# Patient Record
Sex: Male | Born: 1990 | Race: White | Hispanic: Yes | Marital: Married | Smoking: Current every day smoker
Health system: Southern US, Community
[De-identification: ages and names within clinical notes are randomized; demographics above are authoritative.]

## PROBLEM LIST (undated history)

## (undated) DIAGNOSIS — J45909 Unspecified asthma, uncomplicated: Secondary | ICD-10-CM

## (undated) DIAGNOSIS — R109 Unspecified abdominal pain: Secondary | ICD-10-CM

## (undated) DIAGNOSIS — M1990 Unspecified osteoarthritis, unspecified site: Secondary | ICD-10-CM

## (undated) HISTORY — PX: WRIST FRACTURE SURGERY: SHX121

## (undated) HISTORY — PX: FRACTURE SURGERY: SHX138

## (undated) HISTORY — DX: Unspecified abdominal pain: R10.9

---

## 2017-10-30 ENCOUNTER — Encounter (HOSPITAL_COMMUNITY): Payer: Self-pay

## 2017-10-30 ENCOUNTER — Inpatient Hospital Stay (HOSPITAL_COMMUNITY): Payer: Medicaid Other

## 2017-10-30 ENCOUNTER — Inpatient Hospital Stay (HOSPITAL_COMMUNITY)
Admission: EM | Admit: 2017-10-30 | Discharge: 2017-11-02 | DRG: 571 | Disposition: A | Discharge status: Home / Self Care | Payer: Medicaid Other | Attending: Surgery | Admitting: Surgery

## 2017-10-30 ENCOUNTER — Emergency Department (HOSPITAL_COMMUNITY): Payer: Medicaid Other | Admitting: Anesthesiology

## 2017-10-30 ENCOUNTER — Emergency Department (HOSPITAL_COMMUNITY): Payer: Medicaid Other

## 2017-10-30 ENCOUNTER — Encounter (HOSPITAL_COMMUNITY): Admission: EM | Disposition: A | Discharge status: Home / Self Care | Payer: Self-pay | Source: Home / Self Care

## 2017-10-30 DIAGNOSIS — Z716 Tobacco abuse counseling: Secondary | ICD-10-CM

## 2017-10-30 DIAGNOSIS — E876 Hypokalemia: Secondary | ICD-10-CM | POA: Diagnosis present

## 2017-10-30 DIAGNOSIS — W25XXXA Contact with sharp glass, initial encounter: Secondary | ICD-10-CM | POA: Diagnosis present

## 2017-10-30 DIAGNOSIS — Z22322 Carrier or suspected carrier of Methicillin resistant Staphylococcus aureus: Secondary | ICD-10-CM | POA: Diagnosis not present

## 2017-10-30 DIAGNOSIS — D62 Acute posthemorrhagic anemia: Secondary | ICD-10-CM | POA: Diagnosis present

## 2017-10-30 DIAGNOSIS — F1010 Alcohol abuse, uncomplicated: Secondary | ICD-10-CM | POA: Diagnosis present

## 2017-10-30 DIAGNOSIS — S41112A Laceration without foreign body of left upper arm, initial encounter: Secondary | ICD-10-CM

## 2017-10-30 DIAGNOSIS — T1490XA Injury, unspecified, initial encounter: Secondary | ICD-10-CM

## 2017-10-30 DIAGNOSIS — F17210 Nicotine dependence, cigarettes, uncomplicated: Secondary | ICD-10-CM | POA: Diagnosis present

## 2017-10-30 DIAGNOSIS — S45112A Laceration of brachial artery, left side, initial encounter: Secondary | ICD-10-CM | POA: Diagnosis present

## 2017-10-30 DIAGNOSIS — Z4659 Encounter for fitting and adjustment of other gastrointestinal appliance and device: Secondary | ICD-10-CM

## 2017-10-30 DIAGNOSIS — Z978 Presence of other specified devices: Secondary | ICD-10-CM

## 2017-10-30 DIAGNOSIS — S45192A Other specified injury of brachial artery, left side, initial encounter: Secondary | ICD-10-CM

## 2017-10-30 DIAGNOSIS — S45102A Unspecified injury of brachial artery, left side, initial encounter: Secondary | ICD-10-CM

## 2017-10-30 DIAGNOSIS — S41119A Laceration without foreign body of unspecified upper arm, initial encounter: Secondary | ICD-10-CM | POA: Diagnosis present

## 2017-10-30 HISTORY — DX: Unspecified osteoarthritis, unspecified site: M19.90

## 2017-10-30 HISTORY — DX: Unspecified asthma, uncomplicated: J45.909

## 2017-10-30 HISTORY — PX: NERVE EXPLORATION: SHX2082

## 2017-10-30 HISTORY — PX: ARTERY REPAIR: SHX559

## 2017-10-30 LAB — COMPREHENSIVE METABOLIC PANEL
ALT: 23 U/L (ref 0–44)
ANION GAP: 15 (ref 5–15)
AST: 29 U/L (ref 15–41)
Albumin: 3.8 g/dL (ref 3.5–5.0)
Alkaline Phosphatase: 48 U/L (ref 38–126)
BILIRUBIN TOTAL: 0.5 mg/dL (ref 0.3–1.2)
BUN: 11 mg/dL (ref 6–20)
CALCIUM: 8.6 mg/dL — AB (ref 8.9–10.3)
CO2: 15 mmol/L — ABNORMAL LOW (ref 22–32)
CREATININE: 1.36 mg/dL — AB (ref 0.61–1.24)
Chloride: 110 mmol/L (ref 98–111)
GFR, EST AFRICAN AMERICAN: 35 mL/min — AB (ref >60)
GFR, EST NON AFRICAN AMERICAN: 30 mL/min — AB (ref >60)
Glucose, Bld: 168 mg/dL — ABNORMAL HIGH (ref 70–99)
Potassium: 3.4 mmol/L — ABNORMAL LOW (ref 3.5–5.1)
Sodium: 140 mmol/L (ref 135–145)
TOTAL PROTEIN: 6.5 g/dL (ref 6.5–8.1)

## 2017-10-30 LAB — CBC
HCT: 39.4 % (ref 39.0–52.0)
HEMATOCRIT: 31.8 % — AB (ref 39.0–52.0)
HEMOGLOBIN: 13 g/dL (ref 13.0–17.0)
HEMOGLOBIN: 10.3 g/dL — AB (ref 13.0–17.0)
MCH: 28.2 pg (ref 26.0–34.0)
MCH: 28.8 pg (ref 26.0–34.0)
MCHC: 33 g/dL (ref 30.0–36.0)
MCHC: 32.4 g/dL (ref 30.0–36.0)
MCV: 85.5 fL (ref 78.0–100.0)
MCV: 88.8 fL (ref 78.0–100.0)
PLATELETS: 291 10*3/uL (ref 150–400)
Platelets: 200 10*3/uL (ref 150–400)
RBC: 4.61 MIL/uL (ref 4.22–5.81)
RBC: 3.58 MIL/uL — ABNORMAL LOW (ref 4.22–5.81)
RDW: 12.8 % (ref 11.5–15.5)
RDW: 13.2 % (ref 11.5–15.5)
WBC: 8.7 10*3/uL (ref 4.0–10.5)
WBC: 15.4 10*3/uL — AB (ref 4.0–10.5)

## 2017-10-30 LAB — PROTIME-INR
INR: 0.99
Prothrombin Time: 13 seconds (ref 11.4–15.2)

## 2017-10-30 LAB — I-STAT CG4 LACTIC ACID, ED: LACTIC ACID, VENOUS: 3 mmol/L — AB (ref 0.5–1.9)

## 2017-10-30 LAB — TYPE AND SCREEN
ABO/RH(D): O POS
Antibody Screen: NEGATIVE
UNIT DIVISION: 0
Unit division: 0

## 2017-10-30 LAB — PREPARE FRESH FROZEN PLASMA
Unit division: 0
Unit division: 0

## 2017-10-30 LAB — RAPID URINE DRUG SCREEN, HOSP PERFORMED
Amphetamines: NOT DETECTED
Benzodiazepines: POSITIVE — AB
COCAINE: POSITIVE — AB
OPIATES: POSITIVE — AB
Tetrahydrocannabinol: POSITIVE — AB

## 2017-10-30 LAB — URINALYSIS, ROUTINE W REFLEX MICROSCOPIC
Bilirubin Urine: NEGATIVE
Glucose, UA: NEGATIVE mg/dL
Ketones, ur: NEGATIVE mg/dL
LEUKOCYTES UA: NEGATIVE
Nitrite: NEGATIVE
PH: 7 (ref 5.0–8.0)
Protein, ur: NEGATIVE mg/dL
RBC / HPF: >50 RBC/hpf — ABNORMAL HIGH (ref 0–5)
SPECIFIC GRAVITY, URINE: 1.014 (ref 1.005–1.030)

## 2017-10-30 LAB — BPAM FFP
BLOOD PRODUCT EXPIRATION DATE: 201908042359
Blood Product Expiration Date: 201908052359
ISSUE DATE / TIME: 201907160300
ISSUE DATE / TIME: 201907160300
UNIT TYPE AND RH: 6200
Unit Type and Rh: 6200

## 2017-10-30 LAB — POCT I-STAT 3, ART BLOOD GAS (G3+)
Acid-base deficit: 9 mmol/L — ABNORMAL HIGH (ref 0.0–2.0)
BICARBONATE: 18.5 mmol/L — AB (ref 20.0–28.0)
O2 Saturation: 100 %
PO2 ART: 222 mmHg — AB (ref 83.0–108.0)
Patient temperature: 98.8
TCO2: 20 mmol/L — AB (ref 22–32)
pCO2 arterial: 47 mmHg (ref 32.0–48.0)
pH, Arterial: 7.204 — ABNORMAL LOW (ref 7.350–7.450)

## 2017-10-30 LAB — POCT I-STAT, CHEM 8
BUN: 11 mg/dL (ref 6–20)
CREATININE: 1.7 mg/dL — AB (ref 0.61–1.24)
Calcium, Ion: 0.95 mmol/L — ABNORMAL LOW (ref 1.15–1.40)
Chloride: 108 mmol/L (ref 98–111)
GLUCOSE: 170 mg/dL — AB (ref 70–99)
HEMATOCRIT: 38 % — AB (ref 39.0–52.0)
Hemoglobin: 12.9 g/dL — ABNORMAL LOW (ref 13.0–17.0)
Potassium: 3.3 mmol/L — ABNORMAL LOW (ref 3.5–5.1)
Sodium: 139 mmol/L (ref 135–145)
TCO2: 14 mmol/L — ABNORMAL LOW (ref 22–32)

## 2017-10-30 LAB — POCT I-STAT 4, (NA,K, GLUC, HGB,HCT)
GLUCOSE: 127 mg/dL — AB (ref 70–99)
HCT: 28 % — ABNORMAL LOW (ref 39.0–52.0)
HEMOGLOBIN: 9.5 g/dL — AB (ref 13.0–17.0)
Potassium: 3.6 mmol/L (ref 3.5–5.1)
Sodium: 141 mmol/L (ref 135–145)

## 2017-10-30 LAB — CDS SEROLOGY

## 2017-10-30 LAB — I-STAT CHEM 8, ED
BUN: 11 mg/dL (ref 8–23)
CALCIUM ION: 0.95 mmol/L — AB (ref 1.15–1.40)
CREATININE: 1.7 mg/dL — AB (ref 0.61–1.24)
Chloride: 108 mmol/L (ref 98–111)
GLUCOSE: 170 mg/dL — AB (ref 70–99)
HCT: 38 % — ABNORMAL LOW (ref 39.0–52.0)
Hemoglobin: 12.9 g/dL — ABNORMAL LOW (ref 13.0–17.0)
Potassium: 3.3 mmol/L — ABNORMAL LOW (ref 3.5–5.1)
Sodium: 139 mmol/L (ref 135–145)
TCO2: 14 mmol/L — ABNORMAL LOW (ref 22–32)

## 2017-10-30 LAB — BPAM RBC
Blood Product Expiration Date: 201908082359
Blood Product Expiration Date: 201908122359
ISSUE DATE / TIME: 201907160259
ISSUE DATE / TIME: 201907160259
UNIT TYPE AND RH: 9500
Unit Type and Rh: 9500

## 2017-10-30 LAB — ABO/RH: ABO/RH(D): O POS

## 2017-10-30 LAB — LACTIC ACID, PLASMA: LACTIC ACID, VENOUS: 2.4 mmol/L — AB (ref 0.5–1.9)

## 2017-10-30 LAB — MRSA PCR SCREENING: MRSA BY PCR: POSITIVE — AB

## 2017-10-30 LAB — BLOOD PRODUCT ORDER (VERBAL) VERIFICATION

## 2017-10-30 LAB — TRIGLYCERIDES: Triglycerides: 131 mg/dL (ref <150)

## 2017-10-30 LAB — ETHANOL: ALCOHOL ETHYL (B): 260 mg/dL — AB (ref <10)

## 2017-10-30 SURGERY — REPAIR, ARTERY, BRACHIAL
Anesthesia: General | Site: Arm Upper | Laterality: Left

## 2017-10-30 MED ORDER — LORAZEPAM 2 MG/ML IJ SOLN
0.0000 mg | Freq: Four times a day (QID) | INTRAMUSCULAR | Status: AC
  Administered 2017-10-31 (×3): 2 mg via INTRAVENOUS
  Filled 2017-10-30 (×3): qty 1

## 2017-10-30 MED ORDER — LORAZEPAM 2 MG/ML IJ SOLN
1.0000 mg | Freq: Four times a day (QID) | INTRAMUSCULAR | Status: AC | PRN
  Administered 2017-11-01: 1 mg via INTRAVENOUS
  Filled 2017-10-30: qty 1

## 2017-10-30 MED ORDER — ENOXAPARIN SODIUM 40 MG/0.4ML ~~LOC~~ SOLN
40.0000 mg | SUBCUTANEOUS | Status: DC
  Administered 2017-10-31 – 2017-11-02 (×3): 40 mg via SUBCUTANEOUS
  Filled 2017-10-30 (×3): qty 0.4

## 2017-10-30 MED ORDER — POTASSIUM CHLORIDE IN NACL 20-0.9 MEQ/L-% IV SOLN
INTRAVENOUS | Status: DC
  Administered 2017-10-30 – 2017-11-01 (×8): via INTRAVENOUS
  Administered 2017-11-02: 1 mL via INTRAVENOUS
  Filled 2017-10-30 (×8): qty 1000

## 2017-10-30 MED ORDER — EPHEDRINE SULFATE 50 MG/ML IJ SOLN
INTRAMUSCULAR | Status: AC
  Filled 2017-10-30: qty 1

## 2017-10-30 MED ORDER — PROPOFOL 1000 MG/100ML IV EMUL
0.0000 ug/kg/min | INTRAVENOUS | Status: DC
  Administered 2017-10-30: 45 ug/kg/min via INTRAVENOUS
  Administered 2017-10-30: 50 ug/kg/min via INTRAVENOUS
  Filled 2017-10-30 (×2): qty 100

## 2017-10-30 MED ORDER — ROCURONIUM BROMIDE 10 MG/ML (PF) SYRINGE
PREFILLED_SYRINGE | INTRAVENOUS | Status: AC
  Filled 2017-10-30: qty 10

## 2017-10-30 MED ORDER — SODIUM CHLORIDE 0.9 % IR SOLN
Status: DC | PRN
  Administered 2017-10-30: 1000 mL

## 2017-10-30 MED ORDER — FENTANYL CITRATE (PF) 100 MCG/2ML IJ SOLN
100.0000 ug | INTRAMUSCULAR | Status: AC | PRN
  Administered 2017-10-30 (×3): 100 ug via INTRAVENOUS
  Filled 2017-10-30 (×3): qty 2

## 2017-10-30 MED ORDER — KETAMINE HCL 50 MG/5ML IJ SOSY
PREFILLED_SYRINGE | INTRAMUSCULAR | Status: AC
  Filled 2017-10-30: qty 5

## 2017-10-30 MED ORDER — PROPOFOL 1000 MG/100ML IV EMUL
5.0000 ug/kg/min | INTRAVENOUS | Status: DC
  Administered 2017-10-30: 20 ug/kg/min via INTRAVENOUS
  Administered 2017-10-31: 10 ug/kg/min via INTRAVENOUS
  Filled 2017-10-30: qty 100

## 2017-10-30 MED ORDER — ALBUMIN HUMAN 5 % IV SOLN
25.0000 g | Freq: Once | INTRAVENOUS | Status: AC
  Administered 2017-10-30: 25 g via INTRAVENOUS
  Filled 2017-10-30: qty 250

## 2017-10-30 MED ORDER — POTASSIUM CHLORIDE 10 MEQ/100ML IV SOLN
10.0000 meq | INTRAVENOUS | Status: AC
Start: 2017-10-30 — End: 2017-10-30
  Administered 2017-10-30 (×2): 10 meq via INTRAVENOUS
  Filled 2017-10-30: qty 100

## 2017-10-30 MED ORDER — ADULT MULTIVITAMIN W/MINERALS CH
1.0000 | ORAL_TABLET | Freq: Every day | ORAL | Status: DC
Start: 2017-10-30 — End: 2017-11-02
  Administered 2017-10-30 – 2017-11-02 (×2): 1 via ORAL
  Filled 2017-10-30 (×3): qty 1

## 2017-10-30 MED ORDER — MUPIROCIN 2 % EX OINT
1.0000 "application " | TOPICAL_OINTMENT | Freq: Two times a day (BID) | CUTANEOUS | Status: DC
  Administered 2017-10-30 – 2017-11-02 (×6): 1 via NASAL

## 2017-10-30 MED ORDER — DEXMEDETOMIDINE HCL IN NACL 400 MCG/100ML IV SOLN
0.4000 ug/kg/h | INTRAVENOUS | Status: DC
  Administered 2017-10-30: 0.4 ug/kg/h via INTRAVENOUS
  Administered 2017-10-30 – 2017-10-31 (×3): 0.8 ug/kg/h via INTRAVENOUS
  Administered 2017-10-31 – 2017-11-01 (×3): 1 ug/kg/h via INTRAVENOUS
  Administered 2017-11-01: 0.8 ug/kg/h via INTRAVENOUS
  Administered 2017-11-01: 1.2 ug/kg/h via INTRAVENOUS
  Filled 2017-10-30 (×4): qty 100
  Filled 2017-10-30: qty 200
  Filled 2017-10-30 (×4): qty 100

## 2017-10-30 MED ORDER — ORAL CARE MOUTH RINSE
15.0000 mL | OROMUCOSAL | Status: DC
Start: 2017-10-30 — End: 2017-11-02
  Administered 2017-10-30 – 2017-11-01 (×21): 15 mL via OROMUCOSAL

## 2017-10-30 MED ORDER — HEPARIN SODIUM (PORCINE) 1000 UNIT/ML IJ SOLN
INTRAMUSCULAR | Status: DC | PRN
  Administered 2017-10-30: 8000 [IU] via INTRAVENOUS

## 2017-10-30 MED ORDER — LORAZEPAM 2 MG/ML IJ SOLN
0.0000 mg | Freq: Two times a day (BID) | INTRAMUSCULAR | Status: DC
  Administered 2017-11-01: 2 mg via INTRAVENOUS
  Filled 2017-10-30: qty 1

## 2017-10-30 MED ORDER — SUGAMMADEX SODIUM 200 MG/2ML IV SOLN
INTRAVENOUS | Status: AC
  Filled 2017-10-30: qty 2

## 2017-10-30 MED ORDER — ONDANSETRON HCL 4 MG/2ML IJ SOLN: 4.0000 mg | Freq: Four times a day (QID) | INTRAMUSCULAR | Status: DC | PRN

## 2017-10-30 MED ORDER — SODIUM CHLORIDE 0.9 % IV SOLN
INTRAVENOUS | Status: AC
  Filled 2017-10-30: qty 1.2

## 2017-10-30 MED ORDER — FOLIC ACID 1 MG PO TABS
1.0000 mg | ORAL_TABLET | Freq: Every day | ORAL | Status: DC
  Administered 2017-10-30 – 2017-11-02 (×2): 1 mg via ORAL
  Filled 2017-10-30 (×3): qty 1

## 2017-10-30 MED ORDER — ONDANSETRON HCL 4 MG/2ML IJ SOLN
INTRAMUSCULAR | Status: DC | PRN
  Administered 2017-10-30: 4 mg via INTRAVENOUS

## 2017-10-30 MED ORDER — SODIUM CHLORIDE 0.9 % IJ SOLN
INTRAMUSCULAR | Status: AC
  Filled 2017-10-30: qty 10

## 2017-10-30 MED ORDER — CEFAZOLIN SODIUM-DEXTROSE 2-3 GM-%(50ML) IV SOLR
INTRAVENOUS | Status: DC | PRN
  Administered 2017-10-30: 2 g via INTRAVENOUS

## 2017-10-30 MED ORDER — LIDOCAINE 2% (20 MG/ML) 5 ML SYRINGE
INTRAMUSCULAR | Status: AC
  Filled 2017-10-30: qty 5

## 2017-10-30 MED ORDER — LORAZEPAM 2 MG/ML IJ SOLN: 0.0000 mg | Freq: Two times a day (BID) | INTRAMUSCULAR | Status: DC

## 2017-10-30 MED ORDER — MIDAZOLAM HCL 2 MG/2ML IJ SOLN
2.0000 mg | INTRAMUSCULAR | Status: DC | PRN
  Administered 2017-10-30 – 2017-11-01 (×3): 2 mg via INTRAVENOUS
  Filled 2017-10-30 (×3): qty 2

## 2017-10-30 MED ORDER — ALBUMIN HUMAN 5 % IV SOLN
INTRAVENOUS | Status: AC
  Filled 2017-10-30: qty 250

## 2017-10-30 MED ORDER — FENTANYL 2500MCG IN NS 250ML (10MCG/ML) PREMIX INFUSION
0.0000 ug/h | INTRAVENOUS | Status: DC
  Administered 2017-10-30: 50 ug/h via INTRAVENOUS
  Filled 2017-10-30: qty 250

## 2017-10-30 MED ORDER — OXYCODONE HCL 5 MG PO TABS
5.0000 mg | ORAL_TABLET | ORAL | Status: DC | PRN
  Administered 2017-11-01 – 2017-11-02 (×6): 5 mg via ORAL
  Filled 2017-10-30 (×6): qty 1

## 2017-10-30 MED ORDER — HEPARIN SODIUM (PORCINE) 1000 UNIT/ML IJ SOLN
INTRAMUSCULAR | Status: AC
  Filled 2017-10-30: qty 1

## 2017-10-30 MED ORDER — SODIUM CHLORIDE 0.9 % IV SOLN
INTRAVENOUS | Status: DC
  Administered 2017-10-30: 14:00:00 via INTRAVENOUS

## 2017-10-30 MED ORDER — MORPHINE SULFATE (PF) 2 MG/ML IV SOLN
2.0000 mg | INTRAVENOUS | Status: DC | PRN
  Administered 2017-11-01: 2 mg via INTRAVENOUS
  Filled 2017-10-30: qty 1

## 2017-10-30 MED ORDER — PROPOFOL 10 MG/ML IV BOLUS
INTRAVENOUS | Status: DC | PRN
  Administered 2017-10-30: 170 mg via INTRAVENOUS

## 2017-10-30 MED ORDER — LACTATED RINGERS IV SOLN
INTRAVENOUS | Status: DC | PRN
  Administered 2017-10-30 (×2): via INTRAVENOUS

## 2017-10-30 MED ORDER — FENTANYL CITRATE (PF) 250 MCG/5ML IJ SOLN
INTRAMUSCULAR | Status: AC
  Filled 2017-10-30: qty 5

## 2017-10-30 MED ORDER — MORPHINE SULFATE (PF) 4 MG/ML IV SOLN
4.0000 mg | Freq: Once | INTRAVENOUS | Status: AC
  Administered 2017-10-30: 4 mg via INTRAVENOUS

## 2017-10-30 MED ORDER — TETANUS-DIPHTH-ACELL PERTUSSIS 5-2.5-18.5 LF-MCG/0.5 IM SUSP
0.5000 mL | Freq: Once | INTRAMUSCULAR | Status: AC
  Administered 2017-10-30: 0.5 mL via INTRAMUSCULAR
  Filled 2017-10-30: qty 0.5

## 2017-10-30 MED ORDER — LIDOCAINE HCL (CARDIAC) PF 100 MG/5ML IV SOSY
PREFILLED_SYRINGE | INTRAVENOUS | Status: DC | PRN
  Administered 2017-10-30: 60 mg via INTRATRACHEAL

## 2017-10-30 MED ORDER — ARTIFICIAL TEARS OPHTHALMIC OINT
TOPICAL_OINTMENT | OPHTHALMIC | Status: AC
  Filled 2017-10-30: qty 3.5

## 2017-10-30 MED ORDER — THIAMINE HCL 100 MG/ML IJ SOLN: 100.0000 mg | Freq: Every day | INTRAMUSCULAR | Status: DC

## 2017-10-30 MED ORDER — MORPHINE SULFATE (PF) 4 MG/ML IV SOLN
4.0000 mg | INTRAVENOUS | Status: DC | PRN
  Filled 2017-10-30: qty 1

## 2017-10-30 MED ORDER — LORAZEPAM 2 MG/ML IJ SOLN: 0.0000 mg | Freq: Four times a day (QID) | INTRAMUSCULAR | Status: DC

## 2017-10-30 MED ORDER — IPRATROPIUM-ALBUTEROL 0.5-2.5 (3) MG/3ML IN SOLN: 3.0000 mL | Freq: Four times a day (QID) | RESPIRATORY_TRACT | Status: DC | PRN

## 2017-10-30 MED ORDER — PROPOFOL 500 MG/50ML IV EMUL
INTRAVENOUS | Status: DC | PRN
  Administered 2017-10-30: 25 ug/kg/min via INTRAVENOUS

## 2017-10-30 MED ORDER — CHLORHEXIDINE GLUCONATE 0.12% ORAL RINSE (MEDLINE KIT)
15.0000 mL | Freq: Two times a day (BID) | OROMUCOSAL | Status: DC
  Administered 2017-10-30 – 2017-11-01 (×5): 15 mL via OROMUCOSAL

## 2017-10-30 MED ORDER — OXYCODONE HCL 5 MG PO TABS: 2.5000 mg | ORAL_TABLET | ORAL | Status: DC | PRN

## 2017-10-30 MED ORDER — DEXAMETHASONE SODIUM PHOSPHATE 10 MG/ML IJ SOLN
INTRAMUSCULAR | Status: DC | PRN
  Administered 2017-10-30: 10 mg via INTRAVENOUS

## 2017-10-30 MED ORDER — MORPHINE SULFATE (PF) 4 MG/ML IV SOLN
INTRAVENOUS | Status: AC
  Filled 2017-10-30: qty 1

## 2017-10-30 MED ORDER — PROPOFOL 10 MG/ML IV BOLUS
INTRAVENOUS | Status: AC
  Filled 2017-10-30: qty 20

## 2017-10-30 MED ORDER — CHLORHEXIDINE GLUCONATE CLOTH 2 % EX PADS
6.0000 | MEDICATED_PAD | Freq: Every day | CUTANEOUS | Status: DC
  Administered 2017-10-31 – 2017-11-01 (×2): 6 via TOPICAL

## 2017-10-30 MED ORDER — PHENYLEPHRINE 40 MCG/ML (10ML) SYRINGE FOR IV PUSH (FOR BLOOD PRESSURE SUPPORT)
PREFILLED_SYRINGE | INTRAVENOUS | Status: AC
  Filled 2017-10-30: qty 10

## 2017-10-30 MED ORDER — SUCCINYLCHOLINE CHLORIDE 200 MG/10ML IV SOSY
PREFILLED_SYRINGE | INTRAVENOUS | Status: AC
  Filled 2017-10-30: qty 10

## 2017-10-30 MED ORDER — KETAMINE HCL 50 MG/5ML IJ SOSY
50.0000 mg | PREFILLED_SYRINGE | Freq: Once | INTRAMUSCULAR | Status: AC
  Administered 2017-10-30: 50 mg via INTRAVENOUS

## 2017-10-30 MED ORDER — FENTANYL CITRATE (PF) 250 MCG/5ML IJ SOLN
INTRAMUSCULAR | Status: DC | PRN
  Administered 2017-10-30: 100 ug via INTRAVENOUS
  Administered 2017-10-30 (×2): 50 ug via INTRAVENOUS

## 2017-10-30 MED ORDER — SUCCINYLCHOLINE CHLORIDE 20 MG/ML IJ SOLN
INTRAMUSCULAR | Status: DC | PRN
  Administered 2017-10-30: 120 mg via INTRAVENOUS

## 2017-10-30 MED ORDER — ROCURONIUM BROMIDE 100 MG/10ML IV SOLN
INTRAVENOUS | Status: DC | PRN
  Administered 2017-10-30: 30 mg via INTRAVENOUS
  Administered 2017-10-30: 20 mg via INTRAVENOUS
  Administered 2017-10-30: 30 mg via INTRAVENOUS

## 2017-10-30 MED ORDER — MIDAZOLAM HCL 5 MG/5ML IJ SOLN
INTRAMUSCULAR | Status: DC | PRN
  Administered 2017-10-30: 2 mg via INTRAVENOUS

## 2017-10-30 MED ORDER — ALBUMIN HUMAN 5 % IV SOLN
25.0000 g | Freq: Once | INTRAVENOUS | Status: AC
Start: 2017-10-30 — End: 2017-10-30
  Administered 2017-10-30: 25 g via INTRAVENOUS
  Filled 2017-10-30 (×2): qty 500

## 2017-10-30 MED ORDER — ONDANSETRON 4 MG PO TBDP: 4.0000 mg | ORAL_TABLET | Freq: Four times a day (QID) | ORAL | Status: DC | PRN

## 2017-10-30 MED ORDER — OXYCODONE HCL 5 MG PO TABS: 10.0000 mg | ORAL_TABLET | ORAL | Status: DC | PRN

## 2017-10-30 MED ORDER — MORPHINE SULFATE 2 MG/ML IJ SOLN
INTRAMUSCULAR | Status: AC | PRN
  Administered 2017-10-30: 4 mg via INTRAVENOUS

## 2017-10-30 MED ORDER — MORPHINE SULFATE (PF) 2 MG/ML IV SOLN: 1.0000 mg | INTRAVENOUS | Status: DC | PRN

## 2017-10-30 MED ORDER — FENTANYL CITRATE (PF) 100 MCG/2ML IJ SOLN
100.0000 ug | INTRAMUSCULAR | Status: DC | PRN
  Administered 2017-10-30: 100 ug via INTRAVENOUS

## 2017-10-30 MED ORDER — LORAZEPAM 1 MG PO TABS: 1.0000 mg | ORAL_TABLET | Freq: Four times a day (QID) | ORAL | Status: AC | PRN

## 2017-10-30 MED ORDER — SODIUM CHLORIDE 0.9 % IV SOLN
INTRAVENOUS | Status: DC | PRN
  Administered 2017-10-30: 500 mL

## 2017-10-30 MED ORDER — ONDANSETRON HCL 4 MG/2ML IJ SOLN
INTRAMUSCULAR | Status: AC
  Filled 2017-10-30: qty 2

## 2017-10-30 MED ORDER — MIDAZOLAM HCL 2 MG/2ML IJ SOLN
INTRAMUSCULAR | Status: AC
  Filled 2017-10-30: qty 2

## 2017-10-30 MED ORDER — CEFAZOLIN SODIUM 1 G IJ SOLR
INTRAMUSCULAR | Status: AC
  Filled 2017-10-30: qty 20

## 2017-10-30 MED ORDER — SODIUM CHLORIDE 0.9 % IV SOLN
INTRAVENOUS | Status: AC | PRN
  Administered 2017-10-30: 1000 mL via INTRAVENOUS

## 2017-10-30 MED ORDER — ALBUMIN HUMAN 5 % IV SOLN
INTRAVENOUS | Status: DC | PRN
  Administered 2017-10-30 (×2): via INTRAVENOUS

## 2017-10-30 MED ORDER — VITAMIN B-1 100 MG PO TABS
100.0000 mg | ORAL_TABLET | Freq: Every day | ORAL | Status: DC
  Administered 2017-10-30 – 2017-11-02 (×2): 100 mg via ORAL
  Filled 2017-10-30 (×3): qty 1

## 2017-10-30 SURGICAL SUPPLY — 55 items
BANDAGE ACE 4X5 VEL STRL LF (GAUZE/BANDAGES/DRESSINGS) ×6 IMPLANT
BANDAGE ELASTIC 2 LF NS (GAUZE/BANDAGES/DRESSINGS) ×3 IMPLANT
BNDG ESMARK 4X9 LF (GAUZE/BANDAGES/DRESSINGS) IMPLANT
BNDG GAUZE ELAST 4 BULKY (GAUZE/BANDAGES/DRESSINGS) ×3 IMPLANT
CANISTER SUCT 3000ML PPV (MISCELLANEOUS) ×3 IMPLANT
CATH EMB 3FR 80CM (CATHETERS) ×3 IMPLANT
CUFF TOURNIQUET SINGLE 18IN (TOURNIQUET CUFF) IMPLANT
CUFF TOURNIQUET SINGLE 24IN (TOURNIQUET CUFF) IMPLANT
CUFF TOURNIQUET SINGLE 34IN LL (TOURNIQUET CUFF) IMPLANT
DECANTER SPIKE VIAL GLASS SM (MISCELLANEOUS) IMPLANT
DRSG ADAPTIC 3X8 NADH LF (GAUZE/BANDAGES/DRESSINGS) ×3 IMPLANT
ELECT REM PT RETURN 9FT ADLT (ELECTROSURGICAL) ×3
ELECTRODE REM PT RTRN 9FT ADLT (ELECTROSURGICAL) ×2 IMPLANT
GAUZE SPONGE 4X4 12PLY STRL (GAUZE/BANDAGES/DRESSINGS) ×3 IMPLANT
GAUZE SPONGE 4X4 16PLY XRAY LF (GAUZE/BANDAGES/DRESSINGS) IMPLANT
GLOVE BIO SURGEON STRL SZ7.5 (GLOVE) ×6 IMPLANT
GLOVE BIOGEL PI IND STRL 6.5 (GLOVE) ×2 IMPLANT
GLOVE BIOGEL PI IND STRL 7.0 (GLOVE) ×2 IMPLANT
GLOVE BIOGEL PI IND STRL 7.5 (GLOVE) ×2 IMPLANT
GLOVE BIOGEL PI IND STRL 8 (GLOVE) ×2 IMPLANT
GLOVE BIOGEL PI INDICATOR 6.5 (GLOVE) ×1
GLOVE BIOGEL PI INDICATOR 7.0 (GLOVE) ×1
GLOVE BIOGEL PI INDICATOR 7.5 (GLOVE) ×1
GLOVE BIOGEL PI INDICATOR 8 (GLOVE) ×1
GLOVE SURG SS PI 7.5 STRL IVOR (GLOVE) ×6 IMPLANT
GOWN STRL REUS W/ TWL LRG LVL3 (GOWN DISPOSABLE) ×8 IMPLANT
GOWN STRL REUS W/ TWL XL LVL3 (GOWN DISPOSABLE) ×2 IMPLANT
GOWN STRL REUS W/TWL LRG LVL3 (GOWN DISPOSABLE) ×4
GOWN STRL REUS W/TWL XL LVL3 (GOWN DISPOSABLE) ×1
KIT BASIN OR (CUSTOM PROCEDURE TRAY) ×3 IMPLANT
KIT TURNOVER KIT B (KITS) ×3 IMPLANT
NS IRRIG 1000ML POUR BTL (IV SOLUTION) ×3 IMPLANT
PACK CV ACCESS (CUSTOM PROCEDURE TRAY) IMPLANT
PAD ARMBOARD 7.5X6 YLW CONV (MISCELLANEOUS) ×6 IMPLANT
PAD CAST 4YDX4 CTTN HI CHSV (CAST SUPPLIES) IMPLANT
PADDING CAST COTTON 4X4 STRL (CAST SUPPLIES)
PADDING CAST SYNTHETIC 3 NS LF (CAST SUPPLIES) ×1
PADDING CAST SYNTHETIC 3X4 NS (CAST SUPPLIES) ×2 IMPLANT
SPONGE SURGIFOAM ABS GEL 100 (HEMOSTASIS) IMPLANT
STAPLER VISISTAT 35W (STAPLE) ×3 IMPLANT
STRIP CLOSURE SKIN 1/2X4 (GAUZE/BANDAGES/DRESSINGS) IMPLANT
SUT ETHILON 8 0 BV130 4 (SUTURE) ×3 IMPLANT
SUT PROLENE 5 0 C 1 24 (SUTURE) ×3 IMPLANT
SUT PROLENE 6 0 CC (SUTURE) ×15 IMPLANT
SUT PROLENE 7 0 BV 1 (SUTURE) ×9 IMPLANT
SUT VIC AB 0 CT1 27 (SUTURE) ×1
SUT VIC AB 0 CT1 27XBRD ANBCTR (SUTURE) ×2 IMPLANT
SUT VIC AB 3-0 SH 27 (SUTURE) ×1
SUT VIC AB 3-0 SH 27X BRD (SUTURE) ×2 IMPLANT
SUT VICRYL 4-0 PS2 18IN ABS (SUTURE) ×3 IMPLANT
SUT VICRYL RAPIDE 4/0 PS 2 (SUTURE) ×3 IMPLANT
SYRINGE 3CC LL L/F (MISCELLANEOUS) ×3 IMPLANT
TOWEL GREEN STERILE (TOWEL DISPOSABLE) ×3 IMPLANT
UNDERPAD 30X30 (UNDERPADS AND DIAPERS) ×3 IMPLANT
WATER STERILE IRR 1000ML POUR (IV SOLUTION) ×3 IMPLANT
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
## 2017-10-30 NOTE — Anesthesia Procedure Notes (Signed)
Procedure Name: Intubation Date/Time: 10/30/2017 5:18 AM Performed by: Claudina LickMahony, Donell Sliwinski D, CRNA Pre-anesthesia Checklist: Patient identified, Emergency Drugs available, Suction available, Patient being monitored and Timeout performed Patient Re-evaluated:Patient Re-evaluated prior to induction Oxygen Delivery Method: Circle system utilized Preoxygenation: Pre-oxygenation with 100% oxygen Induction Type: IV induction, Rapid sequence and Cricoid Pressure applied Laryngoscope Size: Miller and 2 Grade View: Grade I Tube type: Subglottic suction tube Tube size: 7.5 mm Number of attempts: 1 Airway Equipment and Method: Stylet (Cervical collar loosened for inuduction. Head/neck mainatained in neutral position for induction/DL. Cervical collar tightened to origianl position after induction) Placement Confirmation: ETT inserted through vocal cords under direct vision,  positive ETCO2 and breath sounds checked- equal and bilateral Secured at: 22 cm Tube secured with: Tape Dental Injury: Teeth and Oropharynx as per pre-operative assessment

## 2017-10-30 NOTE — Progress Notes (Signed)
Patient ID: Kyle Gates, male   DOB: 04/17/1876, 27 y.o.   MRN: [id number] Follow up - Trauma Critical Care  Patient Details:    Kyle Gates is an 27 y.o. male.  Lines/tubes : Airway 7.5 mm (Active)  Secured at (cm) 23 cm 10/30/2017  8:22 AM  Measured From Lips 10/30/2017  8:22 AM  Secured Location Right 10/30/2017  8:22 AM  Secured By Pink Tape 10/30/2017  8:22 AM  Cuff Pressure (cm H2O) 30 cm H2O 10/30/2017  8:22 AM  Site Condition Dry 10/30/2017  8:22 AM     Urethral Catheter Jess Barters RNFA Latex 14 Fr. (Active)  Output (mL) 200 mL 10/30/2017  8:00 AM    Microbiology/Sepsis markers: No results found for this or any previous visit.  Anti-infectives:  Anti-infectives (From admission, onward)   None      Best Practice/Protocols:  VTE Prophylaxis: Lovenox (prophylaxtic dose) Continous Sedation  Consults: Treatment Team:  Sherren Kerns, MD    Studies:    Events:  Subjective:    Overnight Issues:   Objective:  Vital signs for last 24 hours: Temp:  [96.1 F (35.6 C)-97.8 F (36.6 C)] 97.8 F (36.6 C) (07/16 0800) Pulse Rate:  [100-112] 111 (07/16 0830) Resp:  [11-20] 16 (07/16 0830) BP: (113-128)/(76-94) 128/84 (07/16 0830) SpO2:  [99 %-100 %] 100 % (07/16 0830) FiO2 (%):  [50 %] 50 % (07/16 0822) Weight:  [81.6 kg (180 lb)] 81.6 kg (180 lb) (07/16 0339)  Hemodynamic parameters for last 24 hours:    Intake/Output from previous day: 07/15 0701 - 07/16 0700 In: 2200 [I.V.:1700; IV Piggyback:500] Out: 100 [Blood:100]  Intake/Output this shift: Total I/O In: 200 [I.V.:200] Out: 250 [Urine:200; Blood:50]  Vent settings for last 24 hours: Vent Mode: PRVC FiO2 (%):  [50 %] 50 % Set Rate:  [16 bmp] 16 bmp Vt Set:  [500 mL-620 mL] 620 mL PEEP:  [5 cmH20] 5 cmH20 Plateau Pressure:  [14 cmH20] 14 cmH20  Physical Exam:  General: on vent Neuro: sedated HEENT/Neck: ETT and collar Resp: clear to auscultation bilaterally CVS: regular rate and  rhythm, S1, S2 normal, no murmur, click, rub or gallop GI: soft, nontender, BS WNL, no r/g Extremities: ace LUE, biphasic L radial doppler signal  Results for orders placed or performed during the hospital encounter of 10/30/17 (from the past 24 hour(s))  Type and screen Ordered by PROVIDER DEFAULT     Status: None   Collection Time: 10/30/17  3:39 AM  Result Value Ref Range   ABO/RH(D) O POS    Antibody Screen NEG    Sample Expiration 11/02/2017    Unit Number [id number]    Blood Component Type RBC LR PHER1    Unit division 00    Status of Unit REL FROM [facility]    Unit tag comment VERBAL ORDERS PER DR HORTON    Transfusion Status OK TO TRANSFUSE    Crossmatch Result      NOT NEEDED Performed at [facility], [address], [place], [place] [address]    Unit Number [id number]    Blood Component Type RED CELLS,LR    Unit division 00    Status of Unit REL FROM [facility]    Unit tag comment VERBAL ORDERS PER DR HORTON    Transfusion Status OK TO TRANSFUSE    Crossmatch Result NOT NEEDED   Prepare fresh frozen plasma     Status: None   Collection Time: 10/30/17  3:39 AM  Result Value Ref Range   Unit Number [id number]    Blood Component Type LIQ PLASMA    Unit division 00    [facility]    Unit tag comment VERBAL ORDERS PER DR HORTON    Transfusion Status OK TO TRANSFUSE    Unit Number [id number]    Blood Component Type LIQ PLASMA    Unit division 00    [facility]    Unit tag comment OK TO TRANSFUSE HORTON    Transfusion Status      OK TO TRANSFUSE Performed at [facility], [address] [place], [place] [address]   ABO/Rh     Status: None (Preliminary result)   Collection Time: 10/30/17  3:39 AM  Result Value Ref Range   ABO/RH(D)      O POS Performed at [facility], [address] [place], [place] [address]   CDS serology     Status: None   Collection Time: 10/30/17  3:40 AM  Result Value Ref Range    CDS serology specimen      SPECIMEN WILL BE HELD FOR 14 DAYS IF TESTING IS REQUIRED  Comprehensive metabolic panel     Status: Abnormal   Collection Time: 10/30/17  3:40 AM  Result Value Ref Range   Sodium 140 135 - 145 mmol/L   Potassium 3.4 (L) 3.5 - 5.1 mmol/L   Chloride 110 98 - 111 mmol/L   CO2 15 (L) 22 - 32 mmol/L   Glucose, Bld 168 (H) 70 - 99 mg/dL   BUN 11 8 - 23 mg/dL   Creatinine, Ser 3.241.36 (H) 0.61 - 1.24 mg/dL   Calcium 8.6 (L) 8.9 - 10.3 mg/dL   Total Protein 6.5 6.5 - 8.1 g/dL   Albumin 3.8 3.5 - 5.0 g/dL   AST 29 15 - 41 U/L   ALT 23 0 - 44 U/L   Alkaline Phosphatase 48 38 - 126 U/L   Total Bilirubin 0.5 0.3 - 1.2 mg/dL   GFR calc non Af Amer 30 (L) >60 mL/min   GFR calc Af Amer 35 (L) >60 mL/min   Anion gap 15 5 - 15  CBC     Status: None   Collection Time: 10/30/17  3:40 AM  Result Value Ref Range   WBC 8.7 4.0 - 10.5 K/uL   RBC 4.61 4.22 - 5.81 MIL/uL   Hemoglobin 13.0 13.0 - 17.0 g/dL   HCT 40.139.4 02.739.0 - 25.352.0 %   MCV 85.5 78.0 - 100.0 fL   MCH 28.2 26.0 - 34.0 pg   MCHC 33.0 30.0 - 36.0 g/dL   RDW 66.412.8 40.311.5 - 47.415.5 %   Platelets 291 150 - 400 K/uL  Ethanol     Status: Abnormal   Collection Time: 10/30/17  3:40 AM  Result Value Ref Range   Alcohol, Ethyl (B) 260 (H) <10 mg/dL  Protime-INR     Status: None   Collection Time: 10/30/17  3:40 AM  Result Value Ref Range   Prothrombin Time 13.0 11.4 - 15.2 seconds   INR 0.99   Triglycerides     Status: None   Collection Time: 10/30/17  3:40 AM  Result Value Ref Range   Triglycerides 131 <150 mg/dL  I-Stat CG4 Lactic Acid, ED     Status: Abnormal   Collection Time: 10/30/17  3:54 AM  Result Value Ref Range   Lactic Acid, Venous 3.00 (HH) 0.5 -  1.9 mmol/L   Comment NOTIFIED PHYSICIAN   I-Stat Chem 8, ED     Status: Abnormal   Collection Time: 10/30/17  3:55 AM  Result Value Ref Range   Sodium 139 135 - 145 mmol/L   Potassium 3.3 (L) 3.5 - 5.1 mmol/L   Chloride 108 98 - 111 mmol/L   BUN 11 8 - 23  mg/dL   Creatinine, Ser 1.61 (H) 0.61 - 1.24 mg/dL   Glucose, Bld 096 (H) 70 - 99 mg/dL   Calcium, Ion 0.45 (L) 1.15 - 1.40 mmol/L   TCO2 14 (L) 22 - 32 mmol/L   Hemoglobin 12.9 (L) 13.0 - 17.0 g/dL   HCT 40.9 (L) 81.1 - 91.4 %    Assessment & Plan: Present on Admission: . Arm laceration . Laceration of arm    LOS: 0 days   Additional comments:I reviewed the patient's new clinical lab test results. and CXR L arm laceration with arterial, nerve, and muscular injuries S/P L brachial artery venous interposition graft by Dr. Darrick Penna 7/16 - hand well perfused, biphasic doppler L radial artery S/P repair L biceps, brachialis, anterior brachial cutaneous nerve, excisional debridement and closure by Dr. Mack Hook 7/16 - he rec no active elbow flexion and limit extension to 40 deg shy of full Acute ventilator dependent respiratory failure - full support today as intoxicants metabolize. Hope to wean to extubate tomorrow. CXR OK. ABG P. C spine - change to Aspen collar. Plan exam once extubated. FEN - replete hypokalemia ABL anemia - CBC at 1200 ETOH abuse - CIWA, CSW SBIRT after extubation. Urine drug screen P VTE - Lovenox Dispo - ICU. He remains unidentified - Trauma Social Work aware and addressing  Critical Care Total Time*: 44 Minutes  Violeta Gelinas, MD, MPH, [facility] Trauma: [phone] General Surgery: [phone]  10/30/2017  *Care during the described time interval was provided by me. I have reviewed this patient's available data, including medical history, events of note, physical examination and test results as part of my evaluation.

## 2017-10-30 NOTE — ED Notes (Signed)
RE-paged Dr. Rayburn MaBlackmon, Per request of Dr. Wilkie AyeHorton.

## 2017-10-30 NOTE — ED Notes (Signed)
Pt taken to OR with primary RN

## 2017-10-30 NOTE — Anesthesia Preprocedure Evaluation (Addendum)
Anesthesia Evaluation  Patient identified by MRN, date of birth, ID band Patient confused    Reviewed: Patient's Chart, lab work & pertinent test results, Unable to perform ROS - Chart review onlyPreop documentation limited or incomplete due to emergent nature of procedure.  Airway Mallampati: II  TM Distance: >3 FB Neck ROM: Full    Dental  (+) Dental Advisory Given, Poor Dentition, Chipped,    Pulmonary asthma ,     + decreased breath sounds      Cardiovascular negative cardio ROS   Rhythm:Regular Rate:Normal     Neuro/Psych negative neurological ROS  negative psych ROS   GI/Hepatic negative GI ROS,   Endo/Other  negative endocrine ROS  Renal/GU negative Renal ROS     Musculoskeletal negative musculoskeletal ROS (+)   Abdominal Normal abdominal exam  (+)   Peds  Hematology negative hematology ROS (+)   Anesthesia Other Findings   Reproductive/Obstetrics                            Lab Results  Component Value Date   WBC 8.7 10/30/2017   HGB 12.9 (L) 10/30/2017   HCT 38.0 (L) 10/30/2017   MCV 85.5 10/30/2017   PLT 291 10/30/2017   Lab Results  Component Value Date   CREATININE 1.70 (H) 10/30/2017   BUN 11 10/30/2017   NA 139 10/30/2017   K 3.3 (L) 10/30/2017   CL 108 10/30/2017   Lab Results  Component Value Date   INR 0.99 10/30/2017     Anesthesia Physical Anesthesia Plan  ASA: II and emergent  Anesthesia Plan: General   Post-op Pain Management:    Induction: Intravenous  PONV Risk Score and Plan: 3 and Ondansetron, Midazolam and Treatment may vary due to age or medical condition  Airway Management Planned: Oral ETT  Additional Equipment: None  Intra-op Plan:   Post-operative Plan: Possible Post-op intubation/ventilation  Informed Consent: I have reviewed the patients History and Physical, chart, labs and discussed the procedure including the risks,  benefits and alternatives for the proposed anesthesia with the patient or authorized representative who has indicated his/her understanding and acceptance.   Dental advisory given, Only emergency history available and History available from chart only  Plan Discussed with: CRNA, Surgeon and Anesthesiologist  Anesthesia Plan Comments:        Anesthesia Quick Evaluation

## 2017-10-30 NOTE — Op Note (Signed)
10/30/2017  7:57 AM  PATIENT:  Kyle Gates  [age] y.o. male  PRE-OPERATIVE DIAGNOSIS:  Left anterior arm laceration  POST-OPERATIVE DIAGNOSIS:  Same, with laceration of the brachialis, biceps, and LABCN  PROCEDURE: Left anterior arm penetrating wound exploration, with exposure of the median and radial nerves, and repair of the biceps, brachialis, LABCN, excisional debridement of skin/SQ and 13cm complex wound closure  SURGEON: Cliffton Astersavid A. Janee Mornhompson, MD  PHYSICIAN ASSISTANT: none  ANESTHESIA:  general  SPECIMENS:  None  DRAINS:   None  EBL:  less than 50 mL  PREOPERATIVE INDICATIONS:  Kyle Gates is a  [age] y.o. male with deep penetrating wound of the anterior surface of the left distal arm.  He was taken expeditiously to the operating room by vascular surgery to address presumed brachial artery injury.  OPERATIVE IMPLANTS: none  OPERATIVE PROCEDURE: The patient was already anesthetized, prepped and draped when I arrived in the operative theater.  Dr. Darrick PennaFields was underway, having slightly extended the wound and exposed the injured vessels.  After completion of the brachial artery vein graft, he turned the patient over to me to continue wound exploration and repair of other structures as indicated.  I carefully explored the wound from one end to the other and survey the damage, to include transaction to the biceps, very proximal near the muscle tendon junction, and near transection of the brachialis.  The median nerve was identified and found to be without injury.  The radial nerve was also exposed and found to be without injury.  The LABCN was transected.    After fully exploring the wound and surveying the injured structures, the wound was irrigated and I repaired the brachialis with 0 Vicryl interrupted sutures.  The biceps was repaired with #2 FiberWire interrupted grasping sutures.  The LABCN was repaired with standard microsurgical technique using 8-0 nylon epineurial sutures for power  loupe magnification.  The forearm fascia was loosely reapproximated with running 3-0 Vicryl suture.  Ulnar and radial pulses were dopplerable at this point.  I performed excisional debridement of skin and subcutaneous tissue at the level superficial to the deep fascia, and then prepared to 13 cm traumatic wound with a combination of 3-0 Vicryl deep dermal buried subcuticular sutures and a running 4-0 Vicryl repeat horizontal mattress suture.  The limb was kept in about 40 degrees of flexion at the elbow at this point, mainly for the sake of the muscle repairs.  Dr. Darrick PennaFields returned to the operating room to reassess the vascular status of the limb.  He again found dopplerable radial and ulnar signals.  I applied a loose dressing with a posterior splint with the elbow at approximately 40 degrees.  In the process of removing the drapes, I discovered a nonpenetrating towel clip that had been used to hold a towel around the hand, with a portion of the dorsal skin of the hand entrapped within the jaws of the clamp.  The skin was not penetrated, and there was no bleeding.  Decision was made by anesthesia to leave him intubated, in large part due to his combativeness preoperatively, and he will be transferred to the ICU in this condition.
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
## 2017-10-30 NOTE — ED Notes (Signed)
RE-paged Dr. Blackmon, Per request of Dr. Horton.  

## 2017-10-30 NOTE — ED Notes (Signed)
Pt ring locked up with security
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
## 2017-10-30 NOTE — ED Notes (Signed)
Pt very combative with staff, cussing, yelling, kicking and swinging arms, orders for restraints

## 2017-10-30 NOTE — Transfer of Care (Signed)
Immediate Anesthesia Transfer of Care Note  Patient: Kyle Gates  Procedure(s) Performed: Interpositional Vein Graft Left Brachial Artery (Left Arm Upper) NERVE EXPLORATION, Repair of two muscles, one nurve and skin. (Left Arm Lower)  Patient Location: NICU  Anesthesia Type:General  Level of Consciousness: Patient remains intubated per anesthesia plan  Airway & Oxygen Therapy: Patient remains intubated per anesthesia plan  Post-op Assessment: Report given to RN and Post -op Vital signs reviewed and stable  Post vital signs: Reviewed and stable  Last Vitals:  Vitals Value Taken Time  BP 128/84 10/30/2017  8:30 AM  Temp    Pulse 113 10/30/2017  8:31 AM  Resp 28 10/30/2017  8:31 AM  SpO2 100 % 10/30/2017  8:31 AM  Vitals shown include unvalidated device data.  Last Pain:  Vitals:   10/30/17 0331  PainSc: 10-Worst pain ever         Complications: No apparent anesthesia complications
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
## 2017-10-30 NOTE — ED Notes (Signed)
Paged Vascular for Dr. Wilkie AyeHorton
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
## 2017-10-30 NOTE — Progress Notes (Signed)
I would prefer to keep his elbow from being fulled extended for the sake of the soft-tissue repairs, including the biceps and brachialis.   In addition, when he is able to work with therapy, there should be no active elbow flexion, and extension should be limited at 40 degrees shy of full extension initially.  Kyle Crouchave Griffin Dewilde, MD Hand Surgery  Mobile [phone]

## 2017-10-30 NOTE — Progress Notes (Signed)
Patient ID: Kyle Gates, male   DOB: [birth year], 27 y.o.   MRN: [id number] I met with his grandmother and brother at the bedside. I updated them on his injuries and the plan of care. She reports [name] lives with his wife in [place] and they have 3 children. He works two jobs. 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 PMHx asthma with a PRN inhaler at home.  Georganna Skeans, MD, MPH, FACS Trauma: [phone] General Surgery: [phone]

## 2017-10-31 ENCOUNTER — Encounter (HOSPITAL_COMMUNITY): Payer: Self-pay | Admitting: Vascular Surgery

## 2017-10-31 LAB — BASIC METABOLIC PANEL WITH GFR
Anion gap: 5 (ref 5–15)
BUN: 14 mg/dL (ref 6–20)
CO2: 23 mmol/L (ref 22–32)
Calcium: 8.4 mg/dL — ABNORMAL LOW (ref 8.9–10.3)
Chloride: 110 mmol/L (ref 98–111)
Creatinine, Ser: 1.02 mg/dL (ref 0.61–1.24)
GFR calc Af Amer: >60 mL/min
GFR calc non Af Amer: >60 mL/min
Glucose, Bld: 104 mg/dL — ABNORMAL HIGH (ref 70–99)
Potassium: 3.8 mmol/L (ref 3.5–5.1)
Sodium: 138 mmol/L (ref 135–145)

## 2017-10-31 LAB — CBC
HCT: 22.7 % — ABNORMAL LOW (ref 39.0–52.0)
Hemoglobin: 7.3 g/dL — ABNORMAL LOW (ref 13.0–17.0)
MCH: 28.6 pg (ref 26.0–34.0)
MCHC: 32.2 g/dL (ref 30.0–36.0)
MCV: 89 fL (ref 78.0–100.0)
PLATELETS: 140 10*3/uL — AB (ref 150–400)
RBC: 2.55 MIL/uL — AB (ref 4.22–5.81)
RDW: 13.1 % (ref 11.5–15.5)
WBC: 7.8 10*3/uL (ref 4.0–10.5)

## 2017-10-31 MED ORDER — FENTANYL CITRATE (PF) 100 MCG/2ML IJ SOLN
100.0000 ug | INTRAMUSCULAR | Status: DC | PRN
  Administered 2017-10-31 – 2017-11-01 (×3): 100 ug via INTRAVENOUS
  Filled 2017-10-31 (×3): qty 2

## 2017-10-31 NOTE — Anesthesia Postprocedure Evaluation (Signed)
Anesthesia Post Note  Patient: Kyle Gates  Procedure(s) Performed: Interpositional Vein Graft Left Brachial Artery (Left Arm Upper) NERVE EXPLORATION, Repair of two muscles, one nurve and skin. (Left Arm Lower)     Patient location during evaluation: PACU Anesthesia Type: General Level of consciousness: awake and alert Pain management: pain level controlled Vital Signs Assessment: post-procedure vital signs reviewed and stable Respiratory status: spontaneous breathing, nonlabored ventilation, respiratory function stable and patient connected to nasal cannula oxygen Cardiovascular status: blood pressure returned to baseline and stable Postop Assessment: no apparent nausea or vomiting Anesthetic complications: no    Last Vitals:  Vitals:   10/31/17 1000 10/31/17 1013  BP: 111/64   Pulse: (!) 56 (!) 59  Resp: 20 20  Temp:    SpO2: 100% 100%    Last Pain:  Vitals:   10/31/17 0800  TempSrc: Axillary  PainSc:                  Phillips Groutarignan, Jamaal Bernasconi

## 2017-10-31 NOTE — Progress Notes (Signed)
Patient ID: Kyle Gates, male   DOB: [birth year], 27 y.o.   MRN: [id number] Follow up - Trauma Critical Care  Patient Details:    Kyle Gates is an 27 y.o. male.  Lines/tubes : Airway 7.5 mm (Active)  Secured at (cm) 23 cm 10/31/2017  8:37 AM  Measured From Lips 10/31/2017  8:37 AM  Secured Location Right 10/31/2017  8:37 AM  Secured By [employer] Tube Holder 10/31/2017  8:37 AM  Tube Holder Repositioned Yes 10/31/2017  8:37 AM  Cuff Pressure (cm H2O) 25 cm H2O 10/31/2017  8:37 AM  Site Condition Dry 10/31/2017  8:37 AM     Urethral Catheter Jess Bartersina Stevens RNFA Latex 14 Fr. (Active)  Indication for Insertion or Continuance of Catheter Unstable critical patients (first 24-48 hours) 10/31/2017  8:00 AM  Site Assessment Clean;Dry;Intact 10/31/2017  8:00 AM  Catheter Maintenance Bag below level of bladder;Catheter secured;Drainage bag/tubing not touching floor;Insertion date on drainage bag;No dependent loops;Seal intact 10/31/2017  8:00 AM  Collection Container Standard drainage bag 10/31/2017  8:00 AM  Securement Method Securing device (Describe) 10/31/2017  8:00 AM  Urinary Catheter Interventions Unclamped 10/31/2017  8:00 AM  Output (mL) 100 mL 10/31/2017  8:00 AM    Microbiology/Sepsis markers: Results for orders placed or performed during the hospital encounter of 10/30/17  MRSA PCR Screening     Status: Abnormal   Collection Time: 10/30/17  8:27 AM  Result Value Ref Range Status   MRSA by PCR POSITIVE (A) NEGATIVE Final    Comment:        The GeneXpert MRSA Assay (FDA approved for NASAL specimens only), is one component of a comprehensive MRSA colonization surveillance program. It is not intended to diagnose MRSA infection nor to guide or monitor treatment for MRSA infections. CRITICAL RESULT CALLED TO, READ BACK BY AND VERIFIED WITH: RN EMILY RENN 10/30/17 AT 1021BY CM Performed at [facility], [address] [place], [place] [address]     Anti-infectives:   Anti-infectives (From admission, onward)   None      Best Practice/Protocols:  VTE Prophylaxis: Lovenox (prophylaxtic dose) Continous Sedation  Consults: Treatment Team:  Sherren KernsFields, Charles E, MD    Studies:    Events:  Subjective:    Overnight Issues:   Objective:  Vital signs for last 24 hours: Temp:  [97.5 F (36.4 C)-99.2 F (37.3 C)] 99.2 F (37.3 C) (07/17 0800) Pulse Rate:  [56-127] 58 (07/17 0900) Resp:  [16-24] 20 (07/17 0900) BP: (88-137)/(49-81) 115/50 (07/17 0900) SpO2:  [98 %-100 %] 100 % (07/17 0900) FiO2 (%):  [30 %] 30 % (07/17 0837)  Hemodynamic parameters for last 24 hours:    Intake/Output from previous day: 07/16 0701 - 07/17 0700 In: 4863.5 [I.V.:3300.3; IV Piggyback:1563.2] Out: 1875 [Urine:1825; Blood:50]  Intake/Output this shift: Total I/O In: 264.8 [I.V.:264.8] Out: 100 [Urine:100]  Vent settings for last 24 hours: Vent Mode: PRVC FiO2 (%):  [30 %] 30 % Set Rate:  [20 bmp] 20 bmp Vt Set:  [010[620 mL] 620 mL PEEP:  [5 cmH20] 5 cmH20 Plateau Pressure:  [16 cmH20-19 cmH20] 16 cmH20  Physical Exam:  General: on vent Neuro: arouses and F/C HEENT/Neck: ETT Resp: clear to auscultation bilaterally CVS: regular rate and rhythm, S1, S2 normal, no murmur, click, rub or gallop GI: soft, nontender, BS WNL, no r/g Extremities: palp L radial pulse  Results for orders placed or performed during the hospital encounter of 10/30/17 (from the past 24 hour(s))  I-STAT  3, arterial blood gas (G3+)     Status: Abnormal   Collection Time: 10/30/17  9:52 AM  Result Value Ref Range   pH, Arterial 7.204 (L) 7.350 - 7.450   pCO2 arterial 47.0 32.0 - 48.0 mmHg   pO2, Arterial 222.0 (H) 83.0 - 108.0 mmHg   Bicarbonate 18.5 (L) 20.0 - 28.0 mmol/L   TCO2 20 (L) 22 - 32 mmol/L   O2 Saturation 100.0 %   Acid-base deficit 9.0 (H) 0.0 - 2.0 mmol/L   Patient temperature 98.8 F    Collection site RADIAL, ALLEN'S TEST ACCEPTABLE    Drawn by Operator     Sample type ARTERIAL   Provider-confirm verbal Blood Bank order - RBC, FFP, Type & Screen; 2 Units; Order taken: 10/30/2017; 2:58 AM; Level 1 Trauma, Emergency Release, STAT 2 units of O negative red cells and 2 units of A plasmas emergency released to the ER @ 0258. All...     Status: None   Collection Time: 10/30/17 10:24 AM  Result Value Ref Range   Blood product order confirm      MD AUTHORIZATION REQUESTED Performed at [facility], [address], [place], [place] [address]   Lactic acid, plasma     Status: Abnormal   Collection Time: 10/30/17 12:09 PM  Result Value Ref Range   Lactic Acid, Venous 2.4 (HH) 0.5 - 1.9 mmol/L  CBC     Status: Abnormal   Collection Time: 10/30/17 12:09 PM  Result Value Ref Range   WBC 15.4 (H) 4.0 - 10.5 K/uL   RBC 3.58 (L) 4.22 - 5.81 MIL/uL   Hemoglobin 10.3 (L) 13.0 - 17.0 g/dL   HCT 60.4 (L) 54.0 - 98.1 %   MCV 88.8 78.0 - 100.0 fL   MCH 28.8 26.0 - 34.0 pg   MCHC 32.4 30.0 - 36.0 g/dL   RDW 19.1 47.8 - 29.5 %   Platelets 200 150 - 400 K/uL  CBC     Status: Abnormal   Collection Time: 10/31/17  3:34 AM  Result Value Ref Range   WBC 7.8 4.0 - 10.5 K/uL   RBC 2.55 (L) 4.22 - 5.81 MIL/uL   Hemoglobin 7.3 (L) 13.0 - 17.0 g/dL   HCT 62.1 (L) 30.8 - 65.7 %   MCV 89.0 78.0 - 100.0 fL   MCH 28.6 26.0 - 34.0 pg   MCHC 32.2 30.0 - 36.0 g/dL   RDW 84.6 96.2 - 95.2 %   Platelets 140 (L) 150 - 400 K/uL  Basic metabolic panel     Status: Abnormal   Collection Time: 10/31/17  3:34 AM  Result Value Ref Range   Sodium 138 135 - 145 mmol/L   Potassium 3.8 3.5 - 5.1 mmol/L   Chloride 110 98 - 111 mmol/L   CO2 23 22 - 32 mmol/L   Glucose, Bld 104 (H) 70 - 99 mg/dL   BUN 14 6 - 20 mg/dL   Creatinine, Ser 8.41 0.61 - 1.24 mg/dL   Calcium 8.4 (L) 8.9 - 10.3 mg/dL   GFR calc non Af Amer >60 >60 mL/min   GFR calc Af Amer >60 >60 mL/min   Anion gap 5 5 - 15    Assessment & Plan: Present on Admission: . Arm laceration . Laceration of arm     LOS: 1 day   Additional comments:I reviewed the patient's new clinical lab test results. . L arm laceration with arterial, nerve, and muscular injuries S/P L brachial artery  venous interposition graft by Dr. Darrick Penna 7/16 - hand well perfused, palp L radial S/P repair L biceps, brachialis, anterior brachial cutaneous nerve, excisional debridement and closure by Dr. Mack Hook 7/16 - he rec no active elbow flexion and limit extension to 40 deg shy of full Acute ventilator dependent respiratory failure - wean to extubate C spine - change to Aspen collar. Plan exam once extubated. FEN - NGT to suction with extubation ABL anemia - CBC at 1200 ETOH abuse - CIWA, CSW SBIRT after extubation. Urine drug screen P VTE - Lovenox Dispo - ICU. I spoke with his brother who will stay with "Nedra Hai" at D/C to help. Critical Care Total Time*: 41 Minutes  Violeta Gelinas, MD, MPH, [facility] Trauma: [phone] General Surgery: [phone]  10/31/2017  *Care during the described time interval was provided by me. I have reviewed this patient's available data, including medical history, events of note, physical examination and test results as part of my evaluation.

## 2017-10-31 NOTE — Progress Notes (Signed)
[facility]  Subjective  - sedated on vent   Objective 111/64 (!) 59 99.2 F (37.3 C) (Axillary) 20 100%  Intake/Output Summary (Last 24 hours) at 10/31/2017 1113 Last data filed at 10/31/2017 1000 Gross per 24 hour  Intake 4843.47 ml  Output 1825 ml  Net 3018.47 ml   Left radial doppler slightly obstructive Ulnar biphasic Hand pink warm well perfused  Assessment/Planning: Patent brachial artery bypass probably had some distal embolization to radial but has compensated flow by ulnar and adequate perfusion  Awaiting extubation tomorrow  Fabienne BrunsCharles Eleonor Ocon 10/31/2017 11:13 AM --  Laboratory Lab Results: Recent Labs    10/30/17 1209 10/31/17 0334  WBC 15.4* 7.8  HGB 10.3* 7.3*  HCT 31.8* 22.7*  PLT 200 140*   BMET Recent Labs    10/30/17 0340 10/30/17 0355 10/30/17 0539 10/31/17 0334  NA 140 139  139 141 138  K 3.4* 3.3*  3.3* 3.6 3.8  CL 110 108  108  --  110  CO2 15*  --   --  23  GLUCOSE 168* 170*  170* 127* 104*  BUN 11 11  11   --  14  CREATININE 1.36* 1.70*  1.70*  --  1.02  CALCIUM 8.6*  --   --  8.4*    COAG Lab Results  Component Value Date   INR 0.99 10/30/2017   No results found for: PTT

## 2017-11-01 ENCOUNTER — Encounter (HOSPITAL_COMMUNITY): Payer: Self-pay | Admitting: General Practice

## 2017-11-01 ENCOUNTER — Telehealth: Payer: Self-pay | Admitting: Vascular Surgery

## 2017-11-01 ENCOUNTER — Other Ambulatory Visit: Payer: Self-pay

## 2017-11-01 LAB — CBC
HCT: 25.1 % — ABNORMAL LOW (ref 39.0–52.0)
Hemoglobin: 8.1 g/dL — ABNORMAL LOW (ref 13.0–17.0)
MCH: 28.6 pg (ref 26.0–34.0)
MCHC: 32.3 g/dL (ref 30.0–36.0)
MCV: 88.7 fL (ref 78.0–100.0)
PLATELETS: 149 10*3/uL — AB (ref 150–400)
RBC: 2.83 MIL/uL — AB (ref 4.22–5.81)
RDW: 12.6 % (ref 11.5–15.5)
WBC: 7.9 10*3/uL (ref 4.0–10.5)

## 2017-11-01 LAB — BASIC METABOLIC PANEL
Anion gap: 7 (ref 5–15)
BUN: 12 mg/dL (ref 6–20)
CO2: 23 mmol/L (ref 22–32)
Calcium: 8.5 mg/dL — ABNORMAL LOW (ref 8.9–10.3)
Chloride: 107 mmol/L (ref 98–111)
Creatinine, Ser: 0.92 mg/dL (ref 0.61–1.24)
GFR calc Af Amer: >60 mL/min (ref >60)
GLUCOSE: 91 mg/dL (ref 70–99)
POTASSIUM: 3.7 mmol/L (ref 3.5–5.1)
Sodium: 137 mmol/L (ref 135–145)

## 2017-11-01 MED ORDER — MENTHOL 3 MG MT LOZG
1.0000 | LOZENGE | OROMUCOSAL | Status: DC | PRN
  Administered 2017-11-01 – 2017-11-02 (×3): 3 mg via ORAL
  Filled 2017-11-01 (×2): qty 9

## 2017-11-01 MED ORDER — MORPHINE SULFATE (PF) 2 MG/ML IV SOLN
1.0000 mg | INTRAVENOUS | Status: DC | PRN
Start: 2017-11-01 — End: 2017-11-02
  Administered 2017-11-01 – 2017-11-02 (×6): 2 mg via INTRAVENOUS
  Filled 2017-11-01 (×6): qty 1

## 2017-11-01 NOTE — Progress Notes (Signed)
[facility]  Subjective  - sedated but following commands  Objective 112/63 71 98.6 F (37 C) (Axillary) 20 100%  Intake/Output Summary (Last 24 hours) at 11/01/2017 0810 Last data filed at 11/01/2017 0600 Gross per 24 hour  Intake 2657.97 ml  Output 2175 ml  Net 482.97 ml   Left upper extremity healing incision 2+ radial pulse  Assessment/Planning: Patent brachial artery bypass Will arrange follow up in a few weeks Call if questions  Kyle Gates 11/01/2017 8:10 AM --  Laboratory Lab Results: Recent Labs    10/31/17 0334 11/01/17 0306  WBC 7.8 7.9  HGB 7.3* 8.1*  HCT 22.7* 25.1*  PLT 140* 149*   BMET Recent Labs    10/31/17 0334 11/01/17 0306  NA 138 137  K 3.8 3.7  CL 110 107  CO2 23 23  GLUCOSE 104* 91  BUN 14 12  CREATININE 1.02 0.92  CALCIUM 8.4* 8.5*    COAG Lab Results  Component Value Date   INR 0.99 10/30/2017   No results found for: PTT

## 2017-11-01 NOTE — Progress Notes (Signed)
Orthopedic Tech Progress Note Patient Details:  Kyle Gates January 12, 1991 [id number]  Ortho Devices Type of Ortho Device: Ace wrap Ortho Device/Splint Interventions: Application   Post Interventions Patient Tolerated: Well Instructions Provided: Care of device   Saul FordyceJennifer C Dabid Godown 11/01/2017, 6:09 PM

## 2017-11-01 NOTE — Progress Notes (Signed)
Patient doing well after extubation.  Okay to transfer to the floor.  Regular diet.  Marta LamasJames O. Gae BonWyatt, III, MD, FACS [phone] Trauma Surgeon

## 2017-11-01 NOTE — Progress Notes (Signed)
Trauma Service Note  Subjective: Patient is awake and alert on the ventilator, but should be extubated soon.    Objective: Vital signs in last 24 hours: Temp:  [97.5 F (36.4 C)-99.9 F (37.7 C)] 99.9 F (37.7 C) (07/18 0800) Pulse Rate:  [51-78] 78 (07/18 0905) Resp:  [13-26] 13 (07/18 0905) BP: (84-126)/(44-74) 115/71 (07/18 0905) SpO2:  [98 %-100 %] 100 % (07/18 0905) FiO2 (%):  [30 %] 30 % (07/18 0739) Last BM Date: (PTA)  Intake/Output from previous day: 07/17 0701 - 07/18 0700 In: 2789.8 [I.V.:2789.8] Out: 2275 [Urine:1575; Emesis/NG output:700] Intake/Output this shift: Total I/O In: -  Out: 1125 [Urine:425; Emesis/NG output:700]  General: No acute distress.  Cooperative on the ventilator  Lungs: Clear to auscultation.  Abd: Benign  Extremities: Left arm in splint, excellent movement and radial pulse.  Neuro: Intact  Lab Results: CBC  Recent Labs    10/31/17 0334 11/01/17 0306  WBC 7.8 7.9  HGB 7.3* 8.1*  HCT 22.7* 25.1*  PLT 140* 149*   BMET Recent Labs    10/31/17 0334 11/01/17 0306  NA 138 137  K 3.8 3.7  CL 110 107  CO2 23 23  GLUCOSE 104* 91  BUN 14 12  CREATININE 1.02 0.92  CALCIUM 8.4* 8.5*   PT/INR Recent Labs    10/30/17 0340  LABPROT 13.0  INR 0.99   ABG Recent Labs    10/30/17 0952  PHART 7.204*  HCO3 18.5*    Studies/Results: No results found.  Anti-infectives: Anti-infectives (From admission, onward)   None      Assessment/Plan: s/p Procedure(s): Interpositional Vein Graft Left Brachial Artery NERVE EXPLORATION, Repair of two muscles, one nurve and skin. d/c foley Advance diet Extubate.  Possibly transfer from the unit today.  LOS: 2 days   Marta LamasJames O. Gae BonWyatt, III, Kyle Gates, Kyle Gates [phone] Trauma Surgeon 11/01/2017

## 2017-11-01 NOTE — Evaluation (Signed)
Physical Therapy Evaluation Patient Details Name: Kyle Gates MRN: [id number] DOB: Oct 18, 1990 Today's Date: 11/01/2017   History of Present Illness  Patient is a 27 y/o male admitted with L arm laceration with arterial, nerve, and muscular injuries.  He is s/p S/P L brachial artery venous interposition graft 7/16 and S/P repair L biceps, brachialis, anterior brachial cutaneous nerve, excisional debridement and closure.  Clinical Impression  Patient presents with decreased ROM and strength L UE s/p trauma as noted above.  Limited session to ROM as per PT order, but likely will benefit as well from PT for mobility next session.  Will need follow up outpatient PT or OT to follow along with protocol per orthopedics.      Follow Up Recommendations Outpatient PT    Equipment Recommendations  Other (comment)(TBA)    Recommendations for Other Services       Precautions / Restrictions Precautions Precautions: Other (comment) Precaution Comments: no elbow extension past 40, no active flexion or supination, remain in splint except for therapy Required Braces or Orthoses: Other Brace/Splint Other Brace/Splint: elbow splint      Mobility  Bed Mobility               General bed mobility comments: stayed in supine for PROM today  Transfers                    Ambulation/Gait                Stairs            Wheelchair Mobility    Modified Rankin (Stroke Patients Only)       Balance                                             Pertinent Vitals/Pain Pain Assessment: Faces Faces Pain Scale: Hurts even more Pain Location: L arm Pain Descriptors / Indicators: Grimacing;Guarding;Operative site guarding Pain Intervention(s): Monitored during session;Repositioned;Patient requesting pain meds-RN notified    Home Living Family/patient expects to be discharged to:: Private residence Living Arrangements: Spouse/significant  other;Parent;Children Available Help at Discharge: Family Type of Home: House Home Access: Stairs to enter Entrance Stairs-Rails: None Entrance Stairs-Number of Steps: 3 Home Layout: One level        Prior Function           Comments: hasn't worked for 2 months     Hand Dominance        Extremity/Trunk Assessment   Upper Extremity Assessment Upper Extremity Assessment: LUE deficits/detail LUE Deficits / Details: PROM flexion limited due to pain to about 110, able to grip with hand, shoulder AAROM flexion and abduction limited by pain to about 80    Lower Extremity Assessment Lower Extremity Assessment: Overall WFL for tasks assessed       Communication   Communication: No difficulties  Cognition Arousal/Alertness: Awake/alert Behavior During Therapy: WFL for tasks assessed/performed Overall Cognitive Status: Within Functional Limits for tasks assessed                                        General Comments      Exercises Other Exercises Other Exercises: performed PROM into flexion, supination, pronation at elbow, AAROM for shoulder flexion and abduction, gripped with hand x 5  to 10 reps of each   Assessment/Plan    PT Assessment Patient needs continued PT services  PT Problem List Decreased range of motion;Decreased activity tolerance;Pain;Decreased strength;Decreased mobility       PT Treatment Interventions DME instruction;Therapeutic activities;Gait training;Therapeutic exercise;Patient/family education;Functional mobility training;Stair training    PT Goals (Current goals can be found in the Care Plan section)  Acute Rehab PT Goals 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 : Unable Difficulty sitting down on and standing up from a chair with arms (e.g., wheelchair, bedside commode, etc,.)?: Unable Help needed moving to and from a bed to chair (including a wheelchair)?: Total Help needed walking in hospital room?: Total Help needed climbing 3-5 steps with a railing? : Total 6 Click Score: 8    End of Session   Activity Tolerance: Patient limited by pain Patient left: in bed;with call bell/phone within reach;with family/visitor present   PT Visit Diagnosis: Pain;Muscle weakness (generalized) (M62.81) Pain - Right/Left: Left Pain - part of body: Arm    Time: [id number] PT Time Calculation (min) (ACUTE ONLY): 24 min   Charges:   PT Evaluation $PT Eval Moderate Complexity: 1 Mod PT Treatments $Therapeutic Exercise: 8-22 mins   PT G CodesSheran Lawless:        Cyndi Durante [place], [place] [phone] 11/01/2017   Elray Mcgregorynthia Kaden Dunkel 11/01/2017, 6:39 PM

## 2017-11-01 NOTE — Progress Notes (Signed)
Pain med given after bath at 3:30, then [facility]. [facility].

## 2017-11-01 NOTE — Progress Notes (Signed)
Transferred from 4N ICU alert,oriented x4. No signs of distress

## 2017-11-01 NOTE — Telephone Encounter (Signed)
sch appt spk to pt wife 11/29/17 1pm p/o PA

## 2017-11-01 NOTE — Progress Notes (Signed)
POD 2 rpr biceps, brachialis & LABCN Intubated still, but interactive Incision benign Hand pink, warm Resting in elbow splint  Intact LT sens R/M/U with intact motor to same  Recs: Keep elbow splinted as is.  May do PROM into flexion with therapy, then return to splint No active elbow flexion  RTC week of 7-29

## 2017-11-01 NOTE — Procedures (Signed)
Extubation Procedure Note  Patient Details:   Name: Kyle Gates DOB: 07-Feb-1991 MRN: [id number]   Airway Documentation:   + cuff leak test prior to extubation Vent end date: 11/01/17 Vent end time: 1025   Evaluation  O2 sats: stable throughout Complications: No apparent complications Patient did tolerate procedure well. Bilateral Breath Sounds: Clear, Diminished   Yes pt able to speak. No stridor noted, no distress noted.  VSS  Jennette KettleBrowning, Beverlie Kurihara Joy 11/01/2017, 10:55 AM

## 2017-11-02 MED ORDER — OXYCODONE HCL 5 MG PO TABS: 5.0000 mg | ORAL_TABLET | ORAL | 0 refills | Status: DC | PRN

## 2017-11-02 MED ORDER — ADULT MULTIVITAMIN W/MINERALS CH: 1.0000 | ORAL_TABLET | Freq: Every day | ORAL | Status: AC
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
## 2017-11-02 NOTE — Progress Notes (Signed)
Physical Therapy Treatment Patient Details Name: Kyle BreachVincent L Ishida MRN: [id number] DOB: 1990/10/29 Today's Date: 11/02/2017    History of Present Illness Patient is a 27 y/o male admitted with L arm laceration with arterial, nerve, and muscular injuries.  He is s/p S/P L brachial artery venous interposition graft 7/16 and S/P repair L biceps, brachialis, anterior brachial cutaneous nerve, excisional debridement and closure.    PT Comments    Pt making excellent progress with functional mobility. He currently demonstrates bed mobility at modified independence, transfers at modified independence and ambulated entire floor with supervision while pushing IV pole with no difficulties. No further acute PT needs identified at this time. Pt is now d/c'd from acute PT.    Follow Up Recommendations  Outpatient PT     Equipment Recommendations  None recommended by PT    Recommendations for Other Services       Precautions / Restrictions Precautions Precautions: Other (comment) Precaution Comments: no elbow extension past 40, no active flexion or supination, remain in splint except for therapy Required Braces or Orthoses: Other Brace/Splint Other Brace/Splint: elbow splint Restrictions Weight Bearing Restrictions: Yes LUE Weight Bearing: Non weight bearing    Mobility  Bed Mobility Overal bed mobility: Modified Independent             General bed mobility comments: stayed in supine for PROM today  Transfers Overall transfer level: Modified independent Equipment used: None                Ambulation/Gait Ambulation/Gait assistance: Supervision Gait Distance (Feet): 500 Feet Assistive device: IV Pole Gait Pattern/deviations: Step-through pattern;WFL(Within Functional Limits) Gait velocity: WFL Gait velocity interpretation: >2.62 ft/sec, indicative of community ambulatory General Gait Details: no instability or LOB, pt with preference for pushing IV pole.    Stairs              Wheelchair Mobility    Modified Rankin (Stroke Patients Only)       Balance Overall balance assessment: Needs assistance Sitting-balance support: Feet supported Sitting balance-Leahy Scale: Good     Standing balance support: During functional activity;No upper extremity supported Standing balance-Leahy Scale: Good                              Cognition Arousal/Alertness: Awake/alert Behavior During Therapy: WFL for tasks assessed/performed Overall Cognitive Status: Within Functional Limits for tasks assessed                                        Exercises Other Exercises Other Exercises: performed PROM into flexion, supination, pronation at elbow, AAROM for shoulder flexion and abduction, gripped with hand x 5 to 10 reps of each    General Comments        Pertinent Vitals/Pain Pain Assessment: Faces Pain Score: 6  Faces Pain Scale: Hurts even more Pain Location: L arm Pain Descriptors / Indicators: Grimacing;Guarding;Operative site guarding Pain Intervention(s): Monitored during session;Repositioned    Home Living Family/patient expects to be discharged to:: Private residence Living Arrangements: Spouse/significant other;Parent;Children Available Help at Discharge: Family Type of Home: House Home Access: Stairs to enter Entrance Stairs-Rails: None Home Layout: One level   Additional Comments: pt lives with wife and 3 children (youngest child is 28 years old)    Prior Function Level of Independence: Independent      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   PT Goals (current goals can now be found in the care plan section) Acute Rehab PT Goals Patient 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 : None Difficulty sitting down on and standing up from a chair with arms (e.g., wheelchair, bedside commode, etc,.)?: None Help needed moving to and from a bed to chair (including a wheelchair)?: None Help needed walking in hospital room?: None Help needed climbing 3-5 steps with a railing? : None 6 Click Score: 24    End of Session   Activity Tolerance: Patient tolerated treatment well Patient left: in bed;with call bell/phone within reach;with family/visitor present Nurse Communication: Mobility status PT Visit Diagnosis: Pain;Muscle weakness (generalized) (M62.81) Pain - Right/Left: Left Pain - part of body: Arm     Time: 1000-1010 PT Time Calculation (min) (ACUTE ONLY): 10 min  Charges:  $Gait Training: 8-22 mins                    G Codes:       [place], PT, DPT [phone]    Alessandra Bevels Gualberto Wahlen 11/02/2017, 10:22 AM

## 2017-11-02 NOTE — Progress Notes (Signed)
OT Note Addendum for Charges    11/02/17 1117  OT Visit Information  Last OT Received On 11/02/17  OT General Charges  $OT Visit 1 Visit  OT Evaluation  $OT Eval Moderate Complexity 1 Mod  OT Treatments  $Therapeutic Exercise 8-22 mins   Katharina Jehle A. Brett Albinooffey, M.S., OTR/L Acute Rehab Department: [phone]

## 2017-11-02 NOTE — Progress Notes (Signed)
Physical Therapy Discharge Patient Details Name: Kyle Gates MRN: [id number] DOB: 02-18-1991 Today's Date: 11/02/2017 Time: 1000-1010 PT Time Calculation (min) (ACUTE ONLY): 10 min  Patient discharged from PT services secondary to goals met and no further PT needs identified.  Please see latest therapy progress note for current level of functioning and progress toward goals.    Progress and discharge plan discussed with patient and/or caregiver: Patient/Caregiver agrees with plan  GP     [place] 11/02/2017, 10:23 AM
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
## 2017-11-02 NOTE — Progress Notes (Signed)
Pt for discharge going home discontinued 3 peripheral IV line, given pain meds Rx, health teachings and next appointment explained and understood, left arm with ace wrapped elevated, pt alert and oriented given pain meds, given all his personal belongings, no s/s of distress noted.
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
## 2017-11-21 ENCOUNTER — Ambulatory Visit: Payer: MEDICAID | Attending: Orthopedic Surgery | Admitting: Occupational Therapy

## 2017-11-29 ENCOUNTER — Ambulatory Visit: Payer: MEDICAID | Admitting: Occupational Therapy

## 2017-11-29 ENCOUNTER — Encounter: Payer: Self-pay | Admitting: Vascular Surgery
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
## 2017-12-24 ENCOUNTER — Ambulatory Visit: Payer: MEDICAID | Attending: Orthopedic Surgery | Admitting: Occupational Therapy

## 2020-02-17 IMAGING — DX DG CHEST 1V PORT
1 series · 1 of 1 positions shown · non-contrast
Comparison: Earlier same day

CLINICAL DATA: Asthma.  Intubated.

EXAM:
PORTABLE CHEST 1 VIEW

[chest ap]
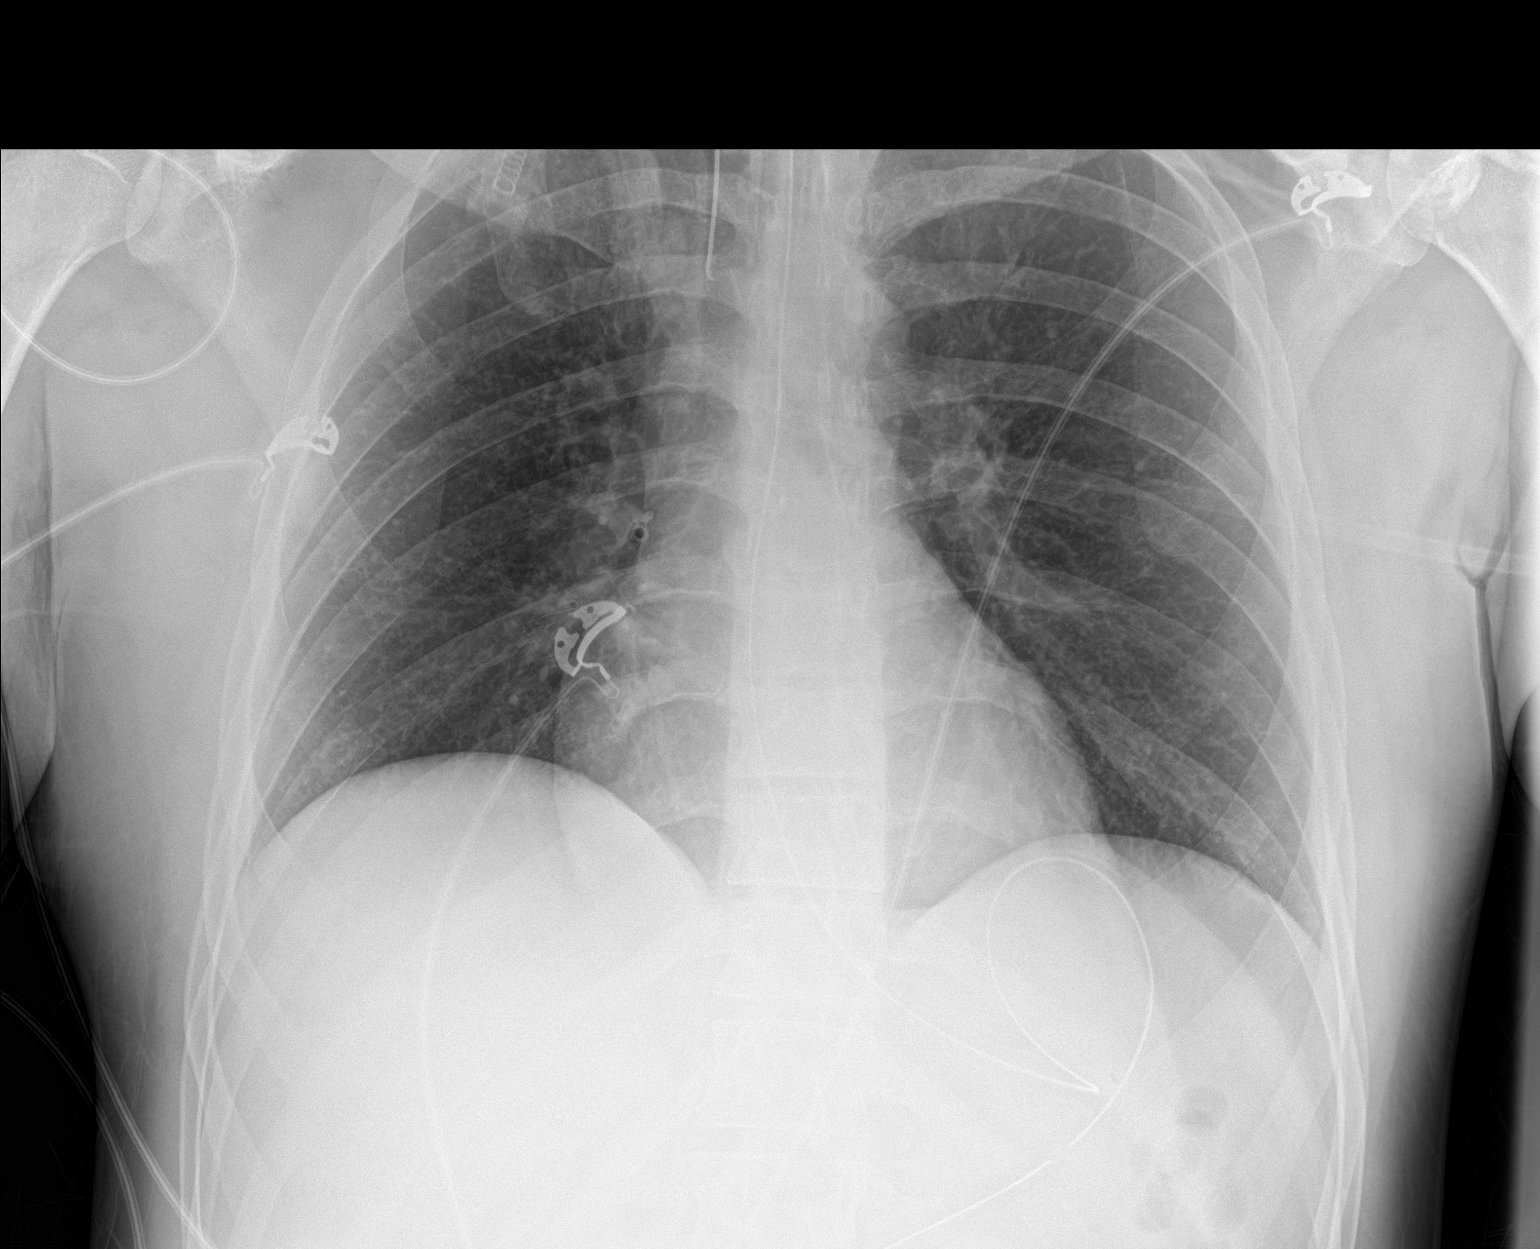

[1 of 1 positions shown; findings below may reference images not displayed]

FINDINGS: Endotracheal tube tip is 3 cm above the carina. Nasogastric tube
enters the stomach. The lungs are clear. The vascularity is normal.
No bone abnormality.
IMPRESSION: Endotracheal tube and nasogastric or orogastric tube well
positioned.

## 2020-11-07 ENCOUNTER — Other Ambulatory Visit: Payer: Self-pay

## 2020-11-07 ENCOUNTER — Ambulatory Visit
Admission: RE | Admit: 2020-11-07 | Discharge: 2020-11-07 | Disposition: A | Discharge status: Home / Self Care | Payer: Medicaid Other | Source: Ambulatory Visit | Attending: Physician Assistant | Admitting: Physician Assistant

## 2020-11-07 VITALS — BP 125/84 | HR 71 | Temp 98.5°F | Resp 16 | Ht 72.0 in | Wt 200.0 lb

## 2020-11-07 DIAGNOSIS — K2970 Gastritis, unspecified, without bleeding: Secondary | ICD-10-CM

## 2020-11-07 DIAGNOSIS — R1012 Left upper quadrant pain: Secondary | ICD-10-CM

## 2020-11-07 MED ORDER — PANTOPRAZOLE SODIUM 40 MG PO TBEC: 40.0000 mg | DELAYED_RELEASE_TABLET | Freq: Every day | ORAL | 0 refills | Status: DC

## 2020-11-07 MED ORDER — SUCRALFATE 1 GM/10ML PO SUSP: 1.0000 g | Freq: Three times a day (TID) | ORAL | 0 refills | Status: DC

## 2020-11-07 NOTE — ED Provider Notes (Signed)
[facility]    CSN: [id number] Arrival date & time: 11/07/20  2542      History   Chief Complaint Chief Complaint  Patient presents with   Abdominal Pain   Appointment    HPI Kyle Gates is a 30 y.o. male presenting for 3 to 4-day history of epigastric, umbilical and left upper quadrant pain.  Pain is worse in the left upper quadrant.  He describes it as dull.  Pain is worse when he eats.  He has not had any radiation of pain.  No fever, fatigue, chills, nausea/vomiting, diarrhea, constipation, dark or bloody stools.  Patient says he does not have any chest pain, cough or B difficulty.  He says he has had similar symptoms in the past and been told he might have a stomach ulcer.  He says he was treated with a liquid in the past which helped him.  He is not sure the name of the liquid.  He did use over-the-counter milk of magnesia and says that it seemed to ease his symptoms a little bit.  Patient denies any chronic NSAID use.  He denies alcohol use but according to his history did drink alcohol heavily in the past.  He is a current smoker.  Patient denies any other complaints or concerns.  HPI  Past Medical History:  Diagnosis Date   Arthritis    "arms, knees" (11/01/2017)   Asthma     Patient Active Problem List   Diagnosis Date Noted   Arm laceration 10/30/2017   Laceration of arm 10/30/2017    Past Surgical History:  Procedure Laterality Date   ARTERY REPAIR Left 10/30/2017   Procedure: Interpositional Vein Graft Left Brachial Artery;  Surgeon: Sherren Kerns, MD;  Location: [facility] OR;  Service: Vascular;  Laterality: Left;   FRACTURE SURGERY     NERVE EXPLORATION Left 10/30/2017   Procedure: NERVE EXPLORATION, Repair of two muscles, one nurve and skin.;  Surgeon: Mack Hook, MD;  Location: [facility] OR;  Service: Orthopedics;  Laterality: Left;   WRIST FRACTURE SURGERY Right ~ 2014       Home Medications    Prior to Admission medications   Medication  Sig Start Date End Date Taking? Authorizing Provider  pantoprazole (PROTONIX) 40 MG tablet Take 1 tablet (40 mg total) by mouth daily for 14 days. 11/07/20 11/21/20 Yes Eusebio Friendly B, PA-C  sucralfate (CARAFATE) 1 GM/10ML suspension Take 10 mLs (1 g total) by mouth 4 (four) times daily -  with meals and at bedtime. 11/07/20  Yes Shirlee Latch, PA-C  Multiple Vitamin (MULTIVITAMIN WITH MINERALS) TABS tablet Take 1 tablet by mouth daily. 11/03/17   Adam Phenix, PA-C    Family History History reviewed. No pertinent family history.  Social History Social History   Tobacco Use   Smoking status: Every Day    Packs/day: 2.00    Years: 13.00    Pack years: 26.00    Types: Cigarettes   Smokeless tobacco: Never  Vaping Use   Vaping Use: Former  Substance Use Topics   Alcohol use: Yes    Alcohol/week: 20.0 standard drinks    Types: 20 Cans of beer per week    Comment: 11/01/2017 "2, 40oz beers X 3 day/wk"   Drug use: Not Currently     Allergies   Amoxicillin   Review of Systems Review of Systems  Constitutional:  Negative for appetite change, fatigue and fever.  HENT:  Negative for congestion and sore  throat.   Respiratory:  Negative for cough and shortness of breath.   Cardiovascular:  Negative for chest pain.  Gastrointestinal:  Positive for abdominal pain. Negative for blood in stool, constipation, diarrhea, nausea, rectal pain and vomiting.  Genitourinary:  Negative for dysuria, frequency and testicular pain.  Musculoskeletal:  Negative for back pain.  Neurological:  Negative for weakness.    Physical Exam Triage Vital Signs ED Triage Vitals  Enc Vitals Group     BP 11/07/20 1027 125/84     Pulse Rate 11/07/20 1027 71     Resp 11/07/20 1027 16     Temp 11/07/20 1027 98.5 F (36.9 C)     Temp Source 11/07/20 1027 Oral     SpO2 11/07/20 1027 98 %     Weight 11/07/20 1025 200 lb (90.7 kg)     Height 11/07/20 1025 6' (1.829 m)     Head Circumference --      Peak  Flow --      Pain Score 11/07/20 1025 5     Pain Loc --      Pain Edu? --      Excl. in GC? --    No data found.  Updated Vital Signs BP 125/84 (BP Location: Left Arm)   Pulse 71   Temp 98.5 F (36.9 C) (Oral)   Resp 16   Ht 6' (1.829 m)   Wt 200 lb (90.7 kg)   SpO2 98%   BMI 27.12 kg/m       Physical Exam Vitals and nursing note reviewed.  Constitutional:      General: He is not in acute distress.    Appearance: Normal appearance. He is well-developed. He is not ill-appearing.  HENT:     Head: Normocephalic and atraumatic.  Eyes:     General: No scleral icterus.    Conjunctiva/sclera: Conjunctivae normal.  Cardiovascular:     Rate and Rhythm: Normal rate and regular rhythm.     Heart sounds: Normal heart sounds.  Pulmonary:     Effort: Pulmonary effort is normal. No respiratory distress.     Breath sounds: Normal breath sounds.  Abdominal:     Palpations: Abdomen is soft.     Tenderness: There is abdominal tenderness in the epigastric area and left upper quadrant.  Musculoskeletal:     Cervical back: Neck supple.  Skin:    General: Skin is warm and dry.  Neurological:     General: No focal deficit present.     Mental Status: He is alert. Mental status is at baseline.     Motor: No weakness.     Gait: Gait normal.  Psychiatric:        Mood and Affect: Mood normal.        Thought Content: Thought content normal.     UC Treatments / Results  Labs (all labs ordered are listed, but only abnormal results are displayed) Labs Reviewed - No data to display  EKG   Radiology No results found.  Procedures Procedures (including critical care time)  Medications Ordered in UC Medications - No data to display  Initial Impression / Assessment and Plan / UC Course  I have reviewed the triage vital signs and the nursing notes.  Pertinent labs & imaging results that were available during my care of the patient were reviewed by me and considered in my medical  decision making (see chart for details).  30 year old male presenting for 3 to 4-day history of epigastric and  left upper quadrant abdominal pain.  Pain is dull and worse with eating food.  Vitals are normal and stable and he is overall well-appearing.  He does have tenderness palpation of the epigastric region and left upper quadrant.  No guarding or rebound.  Chest is clear to auscultation heart regular rate and rhythm.  Symptoms are consistent with gastritis.  Cannot rule out underlying ulcer.  Treating him at this time with pantoprazole and Carafate.  Encouraged him to follow-up with his primary care provider if he is not improving as he may need referral to GI specialist for endoscopy.  ED precautions reviewed.   Final Clinical Impressions(s) / UC Diagnoses   Final diagnoses:  Left upper quadrant abdominal pain  Gastritis without bleeding, unspecified chronicity, unspecified gastritis type     Discharge Instructions      ABDOMINAL PAIN: You may take Tylenol for pain relief. Use medications as directed including antiemetics and antidiarrheal medications if suggested or prescribed. You should increase fluids and electrolytes as well as rest over these next several days. If you have any questions or concerns, or if your symptoms are not improving or if especially if they acutely worsen, please call or stop back to the clinic immediately and we will be happy to help you or go to the ER   ABDOMINAL PAIN RED FLAGS: Seek immediate further care if: symptoms remain the same or worsen over the next 3-7 days, you are unable to keep fluids down, you see blood or mucus in your stool, you vomit black or dark red material, you have a fever of 101.F or higher, you have localized and/or persistent abdominal pain    Unfortunately I cannot place a referral to GI specialist since you have Medicaid.  Any referrals need to come through your PCP.  If you do not know your PCP is you need to check your Medicaid  card and you might need to call them and switch it to someone else.     ED Prescriptions     Medication Sig Dispense Auth. Provider   pantoprazole (PROTONIX) 40 MG tablet Take 1 tablet (40 mg total) by mouth daily for 14 days. 14 tablet Eusebio Friendly B, PA-C   sucralfate (CARAFATE) 1 GM/10ML suspension Take 10 mLs (1 g total) by mouth 4 (four) times daily -  with meals and at bedtime. 420 mL Shirlee Latch, PA-C      PDMP not reviewed this encounter.   Shirlee Latch, PA-C 11/07/20 1112

## 2020-11-07 NOTE — ED Triage Notes (Signed)
Patient c/o LUQ abdominal pain that started 3-4 days ago.  Patient denies N/V/D.  Patient denies hard stools.  Patient denies fevers.

## 2020-11-07 NOTE — Discharge Instructions (Addendum)
ABDOMINAL PAIN: You may take Tylenol for pain relief. Use medications as directed including antiemetics and antidiarrheal medications if suggested or prescribed. You should increase fluids and electrolytes as well as rest over these next several days. If you have any questions or concerns, or if your symptoms are not improving or if especially if they acutely worsen, please call or stop back to the clinic immediately and we will be happy to help you or go to the ER   ABDOMINAL PAIN RED FLAGS: Seek immediate further care if: symptoms remain the same or worsen over the next 3-7 days, you are unable to keep fluids down, you see blood or mucus in your stool, you vomit black or dark red material, you have a fever of 101.F or higher, you have localized and/or persistent abdominal pain    Unfortunately I cannot place a referral to GI specialist since you have Medicaid.  Any referrals need to come through your PCP.  If you do not know your PCP is you need to check your Medicaid card and you might need to call them and switch it to someone else.

## 2020-11-15 ENCOUNTER — Emergency Department: Payer: Medicaid Other

## 2020-11-15 ENCOUNTER — Emergency Department
Admission: EM | Admit: 2020-11-15 | Discharge: 2020-11-15 | Disposition: A | Discharge status: Home / Self Care | Payer: Medicaid Other | Attending: Emergency Medicine | Admitting: Emergency Medicine

## 2020-11-15 ENCOUNTER — Other Ambulatory Visit: Payer: Self-pay

## 2020-11-15 DIAGNOSIS — F17210 Nicotine dependence, cigarettes, uncomplicated: Secondary | ICD-10-CM | POA: Insufficient documentation

## 2020-11-15 DIAGNOSIS — R1013 Epigastric pain: Secondary | ICD-10-CM | POA: Diagnosis present

## 2020-11-15 DIAGNOSIS — J45909 Unspecified asthma, uncomplicated: Secondary | ICD-10-CM | POA: Diagnosis not present

## 2020-11-15 DIAGNOSIS — R1084 Generalized abdominal pain: Secondary | ICD-10-CM

## 2020-11-15 DIAGNOSIS — R110 Nausea: Secondary | ICD-10-CM | POA: Diagnosis not present

## 2020-11-15 LAB — COMPREHENSIVE METABOLIC PANEL
ALT: 24 U/L (ref 0–44)
AST: 22 U/L (ref 15–41)
Albumin: 4.1 g/dL (ref 3.5–5.0)
Alkaline Phosphatase: 62 U/L (ref 38–126)
Anion gap: 10 (ref 5–15)
BUN: 18 mg/dL (ref 6–20)
CO2: 22 mmol/L (ref 22–32)
Calcium: 9.1 mg/dL (ref 8.9–10.3)
Chloride: 106 mmol/L (ref 98–111)
Creatinine, Ser: 1.14 mg/dL (ref 0.61–1.24)
GFR, Estimated: >60 mL/min (ref >60)
Glucose, Bld: 120 mg/dL — ABNORMAL HIGH (ref 70–99)
Potassium: 3.6 mmol/L (ref 3.5–5.1)
Sodium: 138 mmol/L (ref 135–145)
Total Bilirubin: 0.5 mg/dL (ref 0.3–1.2)
Total Protein: 7 g/dL (ref 6.5–8.1)

## 2020-11-15 LAB — URINALYSIS, COMPLETE (UACMP) WITH MICROSCOPIC
Bacteria, UA: NONE SEEN
Bilirubin Urine: NEGATIVE
Glucose, UA: NEGATIVE mg/dL
Ketones, ur: NEGATIVE mg/dL
Leukocytes,Ua: NEGATIVE
Nitrite: NEGATIVE
Protein, ur: NEGATIVE mg/dL
Specific Gravity, Urine: 1.03 (ref 1.005–1.030)
pH: 5 (ref 5.0–8.0)

## 2020-11-15 LAB — CBC
HCT: 45.4 % (ref 39.0–52.0)
Hemoglobin: 15.3 g/dL (ref 13.0–17.0)
MCH: 28.5 pg (ref 26.0–34.0)
MCHC: 33.7 g/dL (ref 30.0–36.0)
MCV: 84.5 fL (ref 80.0–100.0)
Platelets: 208 10*3/uL (ref 150–400)
RBC: 5.37 MIL/uL (ref 4.22–5.81)
RDW: 13 % (ref 11.5–15.5)
WBC: 8.9 10*3/uL (ref 4.0–10.5)
nRBC: 0 % (ref 0.0–0.2)

## 2020-11-15 LAB — LIPASE, BLOOD: Lipase: 29 U/L (ref 11–51)

## 2020-11-15 MED ORDER — PANTOPRAZOLE SODIUM 40 MG PO TBEC: 40.0000 mg | DELAYED_RELEASE_TABLET | Freq: Every day | ORAL | 1 refills | Status: AC

## 2020-11-15 NOTE — ED Provider Notes (Signed)
[facility] Emergency Department Provider Note  Time seen: 6:25 PM  I have reviewed the triage vital signs and the nursing notes.   HISTORY  Chief Complaint Abdominal Pain   HPI Kyle Gates is a 30 y.o. male with a past medical history of arthritis who presents to the emergency department for abdominal pain.  According to the patient for the past 10 to 12 days he has been experiencing abdominal pain mostly in the midline of the abdomen and epigastrium.  Patient was seen by the walk-in clinic and prescribed Protonix and sucralfate.  Patient has been taking these medications for approximately 1 week but states he continues to have abdominal pain so he came to the emergency department for evaluation.  Patient states his biggest concern is his brother passed away from a stomach ulcer thought to be caused by H. pylori.  Patient states very rare alcohol use, rare aspirin use (Goody's powder).  Patient denies any diarrhea but does state nausea at times.  Denies any fever cough.   Past Medical History:  Diagnosis Date   Arthritis    "arms, knees" (11/01/2017)   Asthma     Patient Active Problem List   Diagnosis Date Noted   Arm laceration 10/30/2017   Laceration of arm 10/30/2017    Past Surgical History:  Procedure Laterality Date   ARTERY REPAIR Left 10/30/2017   Procedure: Interpositional Vein Graft Left Brachial Artery;  Surgeon: Sherren Kerns, MD;  Location: [facility] OR;  Service: Vascular;  Laterality: Left;   FRACTURE SURGERY     NERVE EXPLORATION Left 10/30/2017   Procedure: NERVE EXPLORATION, Repair of two muscles, one nurve and skin.;  Surgeon: Mack Hook, MD;  Location: [facility] OR;  Service: Orthopedics;  Laterality: Left;   WRIST FRACTURE SURGERY Right ~ 2014    Prior to Admission medications   Medication Sig Start Date End Date Taking? Authorizing Provider  Multiple Vitamin (MULTIVITAMIN WITH MINERALS) TABS tablet Take 1 tablet by mouth daily.  11/03/17   Adam Phenix, PA-C  pantoprazole (PROTONIX) 40 MG tablet Take 1 tablet (40 mg total) by mouth daily for 14 days. 11/07/20 11/21/20  Eusebio Friendly B, PA-C  sucralfate (CARAFATE) 1 GM/10ML suspension Take 10 mLs (1 g total) by mouth 4 (four) times daily -  with meals and at bedtime. 11/07/20   Shirlee Latch, PA-C    Allergies  Allergen Reactions   Amoxicillin Rash    Has patient had a PCN reaction causing immediate rash, facial/tongue/throat swelling, SOB or lightheadedness with hypotension: Yes Has patient had a PCN reaction causing severe rash involving mucus membranes or skin necrosis: No Has patient had a PCN reaction that required hospitalization: No Has patient had a PCN reaction occurring within the last 10 years: No If all of the above answers are "NO", then may proceed with Cephalosporin use.     No family history on file.  Social History Social History   Tobacco Use   Smoking status: Every Day    Packs/day: 2.00    Years: 13.00    Pack years: 26.00    Types: Cigarettes   Smokeless tobacco: Never  Vaping Use   Vaping Use: Former  Substance Use Topics   Alcohol use: Yes    Alcohol/week: 20.0 standard drinks    Types: 20 Cans of beer per week    Comment: 11/01/2017 "2, 40oz beers X 3 day/wk"   Drug use: Not Currently    Review of Systems Constitutional:  Negative for fever. Cardiovascular: Negative for chest pain. Respiratory: Negative for shortness of breath. Gastrointestinal: Moderate midline abdominal pain mostly in the epigastrium to mid abdomen.  Some nausea.  No diarrhea. Genitourinary: Negative for urinary compaints Musculoskeletal: Negative for musculoskeletal complaints Neurological: Negative for headache All other ROS negative  ____________________________________________   PHYSICAL EXAM:  VITAL SIGNS: ED Triage Vitals  Enc Vitals Group     BP 11/15/20 1527 121/80     Pulse Rate 11/15/20 1527 79     Resp 11/15/20 1527 16     Temp  11/15/20 1527 98.3 F (36.8 C)     Temp Source 11/15/20 1527 Oral     SpO2 11/15/20 1527 99 %     Weight 11/15/20 1527 198 lb 6.6 oz (90 kg)     Height 11/15/20 1527 6' (1.829 m)     Head Circumference --      Peak Flow --      Pain Score 11/15/20 1527 6     Pain Loc --      Pain Edu? --      Excl. in GC? --    Constitutional: Alert and oriented. Well appearing and in no distress. Eyes: Normal exam ENT      Head: Normocephalic and atraumatic.      Mouth/Throat: Mucous membranes are moist. Cardiovascular: Normal rate, regular rhythm. Respiratory: Normal respiratory effort without tachypnea nor retractions. Breath sounds are clear  Gastrointestinal: Soft and nontender. No distention.  Musculoskeletal: Nontender with normal range of motion in all extremities.  Neurologic:  Normal speech and language. No gross focal neurologic deficits  Skin:  Skin is warm, dry and intact.  Psychiatric: Mood and affect are normal.  ____________________________________________   RADIOLOGY  CT scan is negative for acute abnormality  ____________________________________________   INITIAL IMPRESSION / ASSESSMENT AND PLAN / ED COURSE  Pertinent labs & imaging results that were available during my care of the patient were reviewed by me and considered in my medical decision making (see chart for details).   Patient presents emergency department for abdominal pain ongoing for nearly 2 weeks.  Patient's work-up is reassuring, lab work is normal, urinalysis is normal.  Patient does have mild tenderness palpation mostly in the epigastrium and periumbilical area.  Given the patient's pain and duration of symptoms we will obtain CT imaging to rule out other intra-abdominal pain causes such as diverticulitis, colitis ureterolithiasis.  If the work-up is negative I discussed with the patient GI follow-up for consideration of endoscopy or H. pylori test.  Patient agreeable to plan of care.  CT scan negative  for acute abnormality.  Highly suspect gastritis to be the cause of the patient's symptoms.  Discussed using Protonix for 2 months, Maalox as needed for the next 7 days I have referred to GI medicine for further work-up and treatment.  Patient agreeable to plan of care.  LAJARVIS ITALIANO was evaluated in Emergency Department on 11/15/2020 for the symptoms described in the history of present illness. He was evaluated in the context of the global COVID-19 pandemic, which necessitated consideration that the patient might be at risk for infection with the SARS-CoV-2 virus that causes COVID-19. Institutional protocols and algorithms that pertain to the evaluation of patients at risk for COVID-19 are in a state of rapid change based on information released by regulatory bodies including the CDC and federal and state organizations. These policies and algorithms were followed during the patient's care in the ED.  ____________________________________________  FINAL CLINICAL IMPRESSION(S) / ED DIAGNOSES  Abdominal pain   Minna Antis, MD 11/15/20 1940

## 2020-11-15 NOTE — Discharge Instructions (Addendum)
As we discussed please take your medication as prescribed for its entire course.  For the next 1 week you may also use over-the-counter liquid Maalox after each meal and just before going to sleep.  Please call the number provided for GI medicine to arrange a follow-up appointment as soon as possible.  Return to the emergency department for any worsening pain, fever, or any other symptom personally concerning to yourself.

## 2020-11-15 NOTE — ED Triage Notes (Signed)
Pt presents to the ED with c/o diffuse abd pain that began on 7/24. Pt states that he was seen at [facility] for same but symptoms have not improved.

## 2020-11-15 NOTE — ED Notes (Signed)
See triage note  Presents with generalized abd pain  States pain started about 1-1/2 weeks ago  No fever  States was seen at [facility]  But does not feel any better

## 2020-11-23 ENCOUNTER — Emergency Department
Admission: EM | Admit: 2020-11-23 | Discharge: 2020-11-23 | Disposition: A | Discharge status: Home / Self Care | Payer: Medicaid Other | Attending: Emergency Medicine | Admitting: Emergency Medicine

## 2020-11-23 ENCOUNTER — Other Ambulatory Visit: Payer: Self-pay

## 2020-11-23 DIAGNOSIS — R1013 Epigastric pain: Secondary | ICD-10-CM

## 2020-11-23 DIAGNOSIS — F17210 Nicotine dependence, cigarettes, uncomplicated: Secondary | ICD-10-CM | POA: Diagnosis not present

## 2020-11-23 DIAGNOSIS — K2900 Acute gastritis without bleeding: Secondary | ICD-10-CM | POA: Diagnosis not present

## 2020-11-23 DIAGNOSIS — J45909 Unspecified asthma, uncomplicated: Secondary | ICD-10-CM | POA: Insufficient documentation

## 2020-11-23 LAB — LIPASE, BLOOD: Lipase: 23 U/L (ref 11–51)

## 2020-11-23 LAB — CBC
HCT: 46.8 % (ref 39.0–52.0)
Hemoglobin: 15.9 g/dL (ref 13.0–17.0)
MCH: 27.9 pg (ref 26.0–34.0)
MCHC: 34 g/dL (ref 30.0–36.0)
MCV: 82.1 fL (ref 80.0–100.0)
Platelets: 241 10*3/uL (ref 150–400)
RBC: 5.7 MIL/uL (ref 4.22–5.81)
RDW: 13 % (ref 11.5–15.5)
WBC: 9.4 10*3/uL (ref 4.0–10.5)
nRBC: 0 % (ref 0.0–0.2)

## 2020-11-23 LAB — URINALYSIS, COMPLETE (UACMP) WITH MICROSCOPIC
Bacteria, UA: NONE SEEN
Glucose, UA: NEGATIVE mg/dL
Hgb urine dipstick: NEGATIVE
Ketones, ur: 5 mg/dL — AB
Leukocytes,Ua: NEGATIVE
Nitrite: NEGATIVE
Protein, ur: NEGATIVE mg/dL
Specific Gravity, Urine: 1.033 — ABNORMAL HIGH (ref 1.005–1.030)
pH: 8 (ref 5.0–8.0)

## 2020-11-23 LAB — COMPREHENSIVE METABOLIC PANEL
ALT: 18 U/L (ref 0–44)
AST: 24 U/L (ref 15–41)
Albumin: 4 g/dL (ref 3.5–5.0)
Alkaline Phosphatase: 67 U/L (ref 38–126)
Anion gap: 11 (ref 5–15)
BUN: 20 mg/dL (ref 6–20)
CO2: 24 mmol/L (ref 22–32)
Calcium: 9.1 mg/dL (ref 8.9–10.3)
Chloride: 102 mmol/L (ref 98–111)
Creatinine, Ser: 1.22 mg/dL (ref 0.61–1.24)
GFR, Estimated: >60 mL/min (ref >60)
Glucose, Bld: 125 mg/dL — ABNORMAL HIGH (ref 70–99)
Potassium: 3.6 mmol/L (ref 3.5–5.1)
Sodium: 137 mmol/L (ref 135–145)
Total Bilirubin: 1 mg/dL (ref 0.3–1.2)
Total Protein: 7 g/dL (ref 6.5–8.1)

## 2020-11-23 MED ORDER — ALUM & MAG HYDROXIDE-SIMETH 200-200-20 MG/5ML PO SUSP
30.0000 mL | Freq: Once | ORAL | Status: AC
  Administered 2020-11-23: 30 mL via ORAL
  Filled 2020-11-23: qty 30

## 2020-11-23 MED ORDER — SUCRALFATE 1 GM/10ML PO SUSP: 1.0000 g | Freq: Four times a day (QID) | ORAL | 1 refills | Status: AC

## 2020-11-23 MED ORDER — DICYCLOMINE HCL 20 MG PO TABS
20.0000 mg | ORAL_TABLET | Freq: Once | ORAL | Status: AC
  Administered 2020-11-23: 20 mg via ORAL
  Filled 2020-11-23: qty 1

## 2020-11-23 MED ORDER — FAMOTIDINE 20 MG PO TABS: 20.0000 mg | ORAL_TABLET | Freq: Two times a day (BID) | ORAL | 0 refills | Status: AC

## 2020-11-23 MED ORDER — LIDOCAINE VISCOUS HCL 2 % MT SOLN
15.0000 mL | Freq: Once | OROMUCOSAL | Status: AC
  Administered 2020-11-23: 15 mL via ORAL
  Filled 2020-11-23: qty 15

## 2020-11-23 NOTE — ED Triage Notes (Addendum)
Pt here with abd pain x3 weeks. Pt states he has been seen several times for the same complaint but has not gotten any better. Pt states pain is central and does not radiate. Pt states nausea and diarrhea from his meds but otherwise denies. Pt has hx of ulcer.

## 2020-11-23 NOTE — ED Provider Notes (Signed)
[facility] Emergency Department Provider Note   ____________________________________________   Event Date/Time   First MD Initiated Contact with Patient 11/23/20 2313     (approximate)  I have reviewed the triage vital signs and the nursing notes.   HISTORY  Chief Complaint Abdominal Pain    HPI Kyle Gates is a 30 y.o. male who returns to the ED from home with persistent epigastric abdominal gnawing pain.  Patient reports a 3-week history of same.  Describes pain which is nonradiating.  Reports nausea and diarrhea from his medications.  Denies fever, cough, chest pain, shortness of breath, vomiting or dysuria.  Was prescribed Sulcrafate but has run out.  Currently taking Pantoprazole as prescribed as well as Mylanta.  Was seen at [facility] urgent care initially.  Seen in our ED 11/15/2020 with unremarkable CT scan.  Has an upcoming appointment with GI in 1 month.      Past Medical History:  Diagnosis Date   Arthritis    "arms, knees" (11/01/2017)   Asthma     Patient Active Problem List   Diagnosis Date Noted   Arm laceration 10/30/2017   Laceration of arm 10/30/2017    Past Surgical History:  Procedure Laterality Date   ARTERY REPAIR Left 10/30/2017   Procedure: Interpositional Vein Graft Left Brachial Artery;  Surgeon: Sherren Kerns, MD;  Location: [facility] OR;  Service: Vascular;  Laterality: Left;   FRACTURE SURGERY     NERVE EXPLORATION Left 10/30/2017   Procedure: NERVE EXPLORATION, Repair of two muscles, one nurve and skin.;  Surgeon: Mack Hook, MD;  Location: [facility] OR;  Service: Orthopedics;  Laterality: Left;   WRIST FRACTURE SURGERY Right ~ 2014    Prior to Admission medications   Medication Sig Start Date End Date Taking? Authorizing Provider  famotidine (PEPCID) 20 MG tablet Take 1 tablet (20 mg total) by mouth 2 (two) times daily. 11/23/20  Yes Irean Hong, MD  sucralfate (CARAFATE) 1 GM/10ML suspension Take 10 mLs (1 g total) by  mouth 4 (four) times daily. 11/23/20  Yes Irean Hong, MD  Multiple Vitamin (MULTIVITAMIN WITH MINERALS) TABS tablet Take 1 tablet by mouth daily. 11/03/17   Adam Phenix, PA-C  pantoprazole (PROTONIX) 40 MG tablet Take 1 tablet (40 mg total) by mouth daily for 14 days. 11/07/20 11/21/20  Eusebio Friendly B, PA-C  pantoprazole (PROTONIX) 40 MG tablet Take 1 tablet (40 mg total) by mouth daily. 11/15/20 11/15/21  Minna Antis, MD    Allergies Amoxicillin  No family history on file.  Social History Social History   Tobacco Use   Smoking status: Every Day    Packs/day: 2.00    Years: 13.00    Pack years: 26.00    Types: Cigarettes   Smokeless tobacco: Never  Vaping Use   Vaping Use: Former  Substance Use Topics   Alcohol use: Yes    Alcohol/week: 20.0 standard drinks    Types: 20 Cans of beer per week    Comment: 11/01/2017 "2, 40oz beers X 3 day/wk"   Drug use: Not Currently    Review of Systems  Constitutional: No fever/chills Eyes: No visual changes. ENT: No sore throat. Cardiovascular: Denies chest pain. Respiratory: Denies shortness of breath. Gastrointestinal: Positive for abdominal pain and nausea, no vomiting.  Positive for diarrhea.  No constipation. Genitourinary: Negative for dysuria. Musculoskeletal: Negative for back pain. Skin: Negative for rash. Neurological: Negative for headaches, focal weakness or numbness.   ____________________________________________  PHYSICAL EXAM:  VITAL SIGNS: ED Triage Vitals  Enc Vitals Group     BP 11/23/20 1310 118/80     Pulse Rate 11/23/20 1310 90     Resp 11/23/20 1310 20     Temp 11/23/20 1310 98.2 F (36.8 C)     Temp Source 11/23/20 1310 Oral     SpO2 11/23/20 1310 97 %     Weight 11/23/20 1310 200 lb (90.7 kg)     Height 11/23/20 1310 6' (1.829 m)     Head Circumference --      Peak Flow --      Pain Score 11/23/20 1320 8     Pain Loc --      Pain Edu? --      Excl. in GC? --     Constitutional:  Alert and oriented. Well appearing and in no acute distress. Eyes: Conjunctivae are normal. PERRL. EOMI. Head: Atraumatic. Nose: No congestion/rhinnorhea. Mouth/Throat: Mucous membranes are moist.   Neck: No stridor.   Cardiovascular: Normal rate, regular rhythm. Grossly normal heart sounds.  Good peripheral circulation. Respiratory: Normal respiratory effort.  No retractions. Lungs CTAB. Gastrointestinal: Soft and mildly tender to palpation epigastrium without rebound or guarding. No distention. No abdominal bruits. No CVA tenderness. Musculoskeletal: No lower extremity tenderness nor edema.  No joint effusions. Neurologic:  Normal speech and language. No gross focal neurologic deficits are appreciated. No gait instability. Skin:  Skin is warm, dry and intact. No rash noted. Psychiatric: Mood and affect are normal. Speech and behavior are normal.  ____________________________________________   LABS (all labs ordered are listed, but only abnormal results are displayed)  Labs Reviewed  COMPREHENSIVE METABOLIC PANEL - Abnormal; Notable for the following components:      Result Value   Glucose, Bld 125 (*)    All other components within normal limits  URINALYSIS, COMPLETE (UACMP) WITH MICROSCOPIC - Abnormal; Notable for the following components:   Color, Urine YELLOW (*)    APPearance CLEAR (*)    Specific Gravity, Urine 1.033 (*)    Bilirubin Urine SMALL (*)    Ketones, ur 5 (*)    All other components within normal limits  LIPASE, BLOOD  CBC   ____________________________________________  EKG  None ____________________________________________  RADIOLOGY I, Latria Mccarron J, personally viewed and evaluated these images (plain radiographs) as part of my medical decision making, as well as reviewing the written report by the radiologist.  ED MD interpretation: None  Official radiology report(s): No results  found.  ____________________________________________   PROCEDURES  Procedure(s) performed (including Critical Care):  Procedures   ____________________________________________   INITIAL IMPRESSION / ASSESSMENT AND PLAN / ED COURSE  As part of my medical decision making, I reviewed the following data within the electronic MEDICAL RECORD NUMBER Nursing notes reviewed and incorporated, Labs reviewed, Old chart reviewed, and Notes from prior ED visits     30 year old male presenting with a 3-week history of epigastric abdominal pain Differential diagnosis includes, but is not limited to, biliary disease (biliary colic, acute cholecystitis, cholangitis, choledocholithiasis, etc), intrathoracic causes for epigastric abdominal pain including ACS, gastritis, duodenitis, pancreatitis, small bowel or large bowel obstruction, abdominal aortic aneurysm, hernia, and ulcer(s).   Laboratory results unremarkable.  I personally reviewed patient's visits from urgent care as well as the ED and his unremarkable CT scan with unremarkable gallbladder on previous ED visit.  Do not feel ultrasound would be helpful.  We will refill Sulcrafate prescription, add Pepcid in addition to the Pantoprazole he  is already taking.  Encouraged bland diet.  Patient has upcoming appointment with GI; stressed the importance of following up with GI.  Strict return precautions given.  Patient verbalizes understanding and agrees with plan of care.      ____________________________________________   FINAL CLINICAL IMPRESSION(S) / ED DIAGNOSES  Final diagnoses:  Epigastric pain  Acute gastritis without hemorrhage, unspecified gastritis type     ED Discharge Orders          Ordered    sucralfate (CARAFATE) 1 GM/10ML suspension  4 times daily        11/23/20 2332    famotidine (PEPCID) 20 MG tablet  2 times daily        11/23/20 2332             Note:  This document was prepared using Dragon voice recognition  software and may include unintentional dictation errors.    Irean Hong, MD 11/24/20 Emeline Darling

## 2020-11-23 NOTE — Discharge Instructions (Addendum)
1.  Please restart Carafate 3 times daily with meals and at bedtime. 2.  Continue pantoprazole as prescribed. 3.  Add Pepcid 20 mg twice daily. 4.  Avoid spicy, heavy, greasy foods and alcohol. 5.  Return to the ER for worsening symptoms, persistent vomiting, difficulty breathing or other concerns.

## 2021-01-03 ENCOUNTER — Encounter: Payer: Self-pay | Admitting: Gastroenterology

## 2021-01-03 ENCOUNTER — Other Ambulatory Visit: Payer: Self-pay

## 2021-01-03 ENCOUNTER — Ambulatory Visit (INDEPENDENT_AMBULATORY_CARE_PROVIDER_SITE_OTHER): Payer: Medicaid Other | Admitting: Gastroenterology

## 2021-01-03 ENCOUNTER — Other Ambulatory Visit
Admission: RE | Admit: 2021-01-03 | Discharge: 2021-01-03 | Disposition: A | Discharge status: Home / Self Care | Payer: Medicaid Other | Attending: Gastroenterology | Admitting: Gastroenterology

## 2021-01-03 VITALS — BP 108/71 | HR 97 | Temp 97.9°F | Ht 72.0 in | Wt 207.0 lb

## 2021-01-03 DIAGNOSIS — R1084 Generalized abdominal pain: Secondary | ICD-10-CM

## 2021-01-03 NOTE — Progress Notes (Signed)
Gastroenterology Consultation  Referring Provider:     Gareth Morgan Primary Care Physician:  Pcp, No Primary Gastroenterologist:  Dr. Servando Snare     Reason for Consultation:     Epigastric pain        HPI:   Kyle Gates is a 30 y.o. y/o male referred for consultation & management of epigastric pain by Dr. Oneita Hurt, No.  This patient comes in today after being seen in urgent care and the ER no less than 3 times since August of this year for abdominal pain.  The patient's work-up has been negative to date.  The patient was reported to be told to follow-up with GI for the pain.  The patient was treated with Mylanta and Protonix and also had been given Carafate but continues to have the pain.  The patient reports that after the last visit to the emergency room he was given a medication that he states has completely resolved his symptoms.  The patient hasn't had any further abdominal pain.  The patient was told that they thought he had an ulcer.  From reviewing the chart the patient appears to have been started on Pepcid.  He also reports an approximate 6 months ago he had similar abdominal pain that was treated with a medication by the ER and then his symptoms went away at that time.  The patient is not able to recall any of the medications he had been given.  He also reports that he has been under a lot of stress recently since he lost his brother.  He states that his brother had "H pylori ulcers".  Past Medical History:  Diagnosis Date   Arthritis    "arms, knees" (11/01/2017)   Asthma     Past Surgical History:  Procedure Laterality Date   ARTERY REPAIR Left 10/30/2017   Procedure: Interpositional Vein Graft Left Brachial Artery;  Surgeon: Sherren Kerns, MD;  Location: [facility] OR;  Service: Vascular;  Laterality: Left;   FRACTURE SURGERY     NERVE EXPLORATION Left 10/30/2017   Procedure: NERVE EXPLORATION, Repair of two muscles, one nurve and skin.;  Surgeon: Mack Hook, MD;   Location: [facility] OR;  Service: Orthopedics;  Laterality: Left;   WRIST FRACTURE SURGERY Right ~ 2014    Prior to Admission medications   Medication Sig Start Date End Date Taking? Authorizing Provider  famotidine (PEPCID) 20 MG tablet Take 1 tablet (20 mg total) by mouth 2 (two) times daily. 11/23/20   Irean Hong, MD  Multiple Vitamin (MULTIVITAMIN WITH MINERALS) TABS tablet Take 1 tablet by mouth daily. 11/03/17   Adam Phenix, PA-C  pantoprazole (PROTONIX) 40 MG tablet Take 1 tablet (40 mg total) by mouth daily for 14 days. 11/07/20 11/21/20  Eusebio Friendly B, PA-C  pantoprazole (PROTONIX) 40 MG tablet Take 1 tablet (40 mg total) by mouth daily. 11/15/20 11/15/21  Minna Antis, MD  sucralfate (CARAFATE) 1 GM/10ML suspension Take 10 mLs (1 g total) by mouth 4 (four) times daily. 11/23/20   Irean Hong, MD    No family history on file.   Social History   Tobacco Use   Smoking status: Every Day    Packs/day: 2.00    Years: 13.00    Pack years: 26.00    Types: Cigarettes   Smokeless tobacco: Never  Vaping Use   Vaping Use: Former  Substance Use Topics   Alcohol use: Yes    Alcohol/week: 20.0 standard drinks  Types: 20 Cans of beer per week    Comment: 11/01/2017 "2, 40oz beers X 3 day/wk"   Drug use: Not Currently    Allergies as of 01/03/2021 - Review Complete 11/23/2020  Allergen Reaction Noted   Amoxicillin Rash 10/30/2017    Review of Systems:    All systems reviewed and negative except where noted in HPI.   Physical Exam:  There were no vitals taken for this visit. No LMP for male patient. General:   Alert,  Well-developed, well-nourished, pleasant and cooperative in NAD Head:  Normocephalic and atraumatic. Eyes:  Sclera clear, no icterus.   Conjunctiva pink. Ears:  Normal auditory acuity. Neck:  Supple; no masses or thyromegaly. Lungs:  Respirations even and unlabored.  Clear throughout to auscultation.   No wheezes, crackles, or rhonchi. No acute  distress. Heart:  Regular rate and rhythm; no murmurs, clicks, rubs, or gallops. Abdomen:  Normal bowel sounds.  No bruits.  Soft, non-tender and non-distended without masses, hepatosplenomegaly or hernias noted.  No guarding or rebound tenderness.  Negative Carnett sign.   Rectal:  Deferred.  Pulses:  Normal pulses noted. Extremities:  No clubbing or edema.  No cyanosis. Neurologic:  Alert and oriented x3;  grossly normal neurologically. Skin:  Intact without significant lesions or rashes.  No jaundice. Lymph Nodes:  No significant cervical adenopathy. Psych:  Alert and cooperative. Normal mood and affect.  Imaging Studies: No results found.  Assessment and Plan:   [name] is a 30 y.o. y/o male Who comes in today after being in the emergency room more urgent care 3 times in the last few months.  The patient was treated with multiple PPIs and H2 blocker and Carafate.  The patient is no longer having any symptoms and is off of all medication.  The patient was taking NSAIDs but has since stopped taking the NSAIDs.  The patient has been told that he should be set up for a blood test for H. Pylori and treated if positive. Otherwise he has been told to contact me if his pain should start and at that time he can be started on medication to help with his possible peptic ulcer disease. The patient has been told that with his symptoms being gone of present time there is no need to pursue any endoscopic procedures at this time. The patient has been explained the plan and agrees with it.    Midge Minium, MD. Clementeen Graham    Note: This dictation was prepared with Dragon dictation along with smaller phrase technology. Any transcriptional errors that result from this process are unintentional.

## 2021-01-04 LAB — H. PYLORI ANTIBODY, IGG: H Pylori IgG: 0.28 Index Value (ref 0.00–0.79)

## 2021-01-05 ENCOUNTER — Telehealth: Payer: Self-pay

## 2021-01-05 NOTE — Telephone Encounter (Signed)
Pt notified of lab results

## 2021-01-05 NOTE — Telephone Encounter (Signed)
-----   Message from Midge Minium, MD sent at 01/04/2021  9:39 PM EDT ----- At the patient know that his H. Pylori blood test is negative for any infection with H. Pylori.

## 2023-03-05 IMAGING — CT CT ABD-PELV W/O CM
2 of 4 series · 16 of 46 positions shown, 18 images · non-contrast
Comparison: None.

CLINICAL DATA: Acute abdominal pain

EXAM:
CT ABDOMEN AND PELVIS WITHOUT CONTRAST
TECHNIQUE: Multidetector CT imaging of the abdomen and pelvis was performed
following the standard protocol without IV contrast.

[Series 2: routine abd/pel wo · axial · 0.75mm/px · z∈[-1135,-675]mm · 13 of 102 slices shown, 15 images]
[im 5/102  soft-tissue]
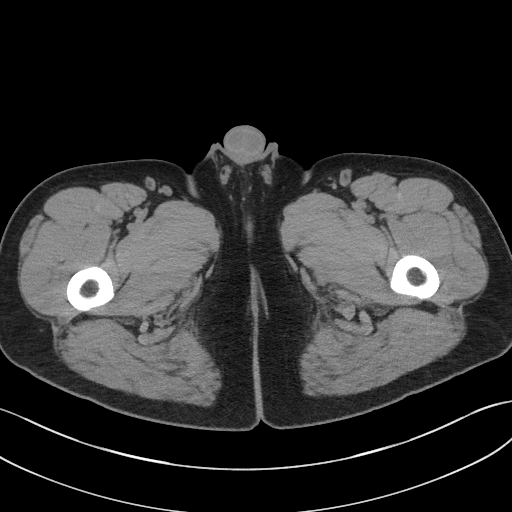
[im 5/102  bone]
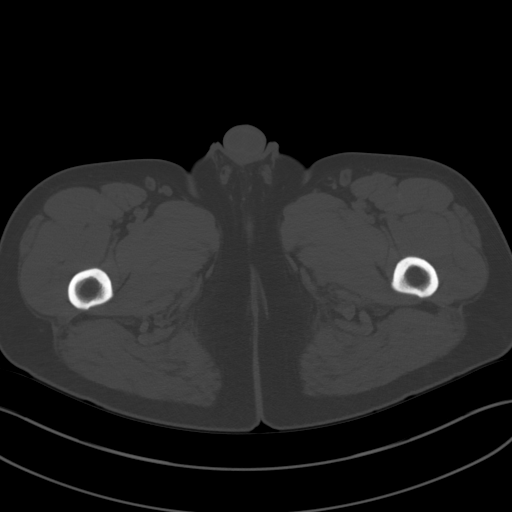
[im 13/102  soft-tissue]
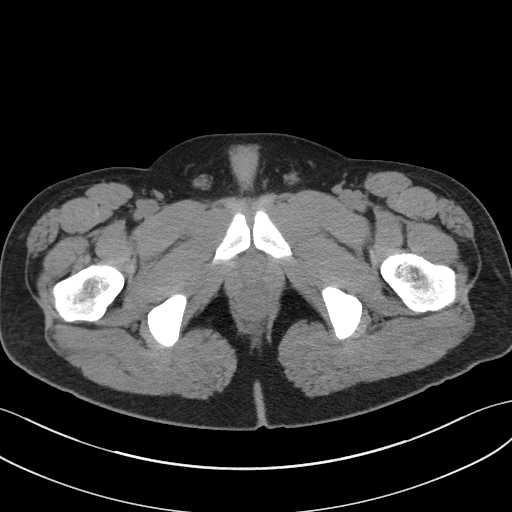
[im 21/102  soft-tissue]
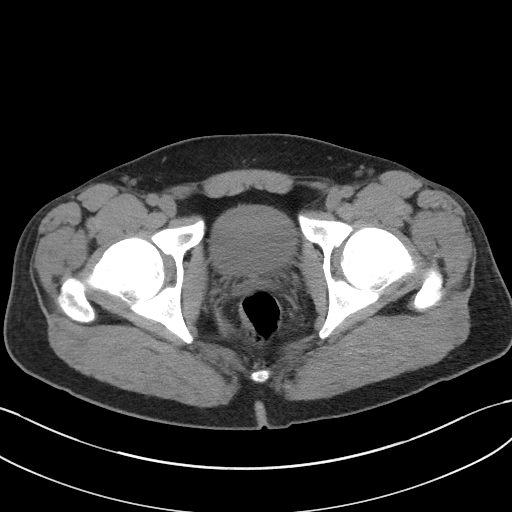
[im 29/102  soft-tissue]
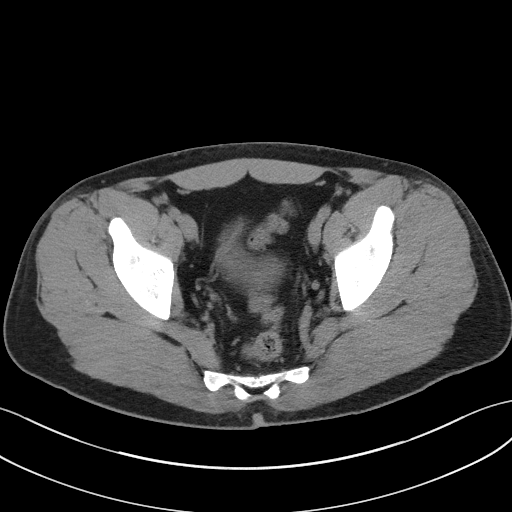
[im 37/102  soft-tissue]
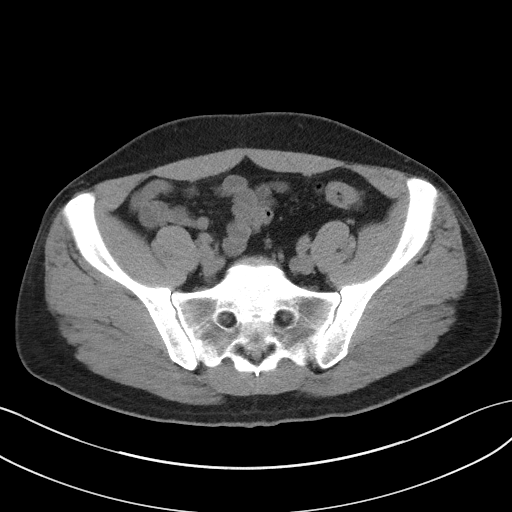
[im 45/102  soft-tissue]
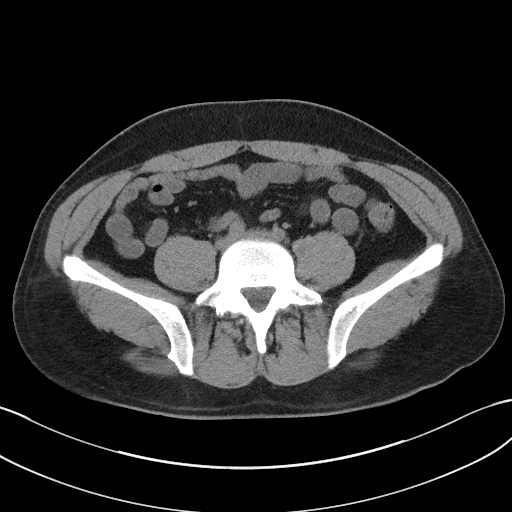
[im 53/102  soft-tissue]
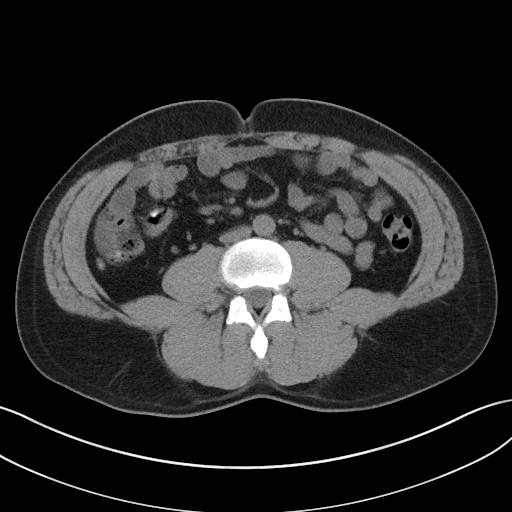
[im 57/102  soft-tissue]
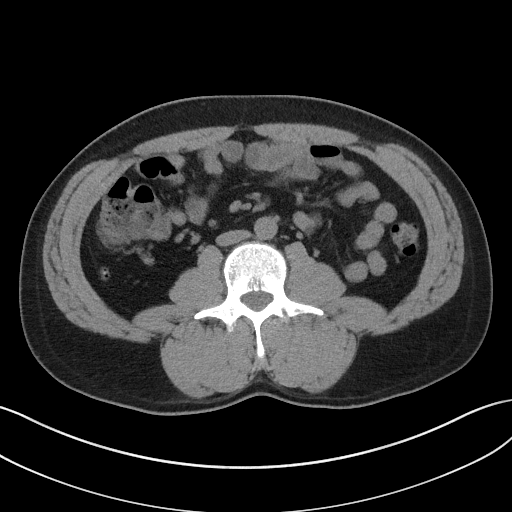
[im 65/102  soft-tissue]
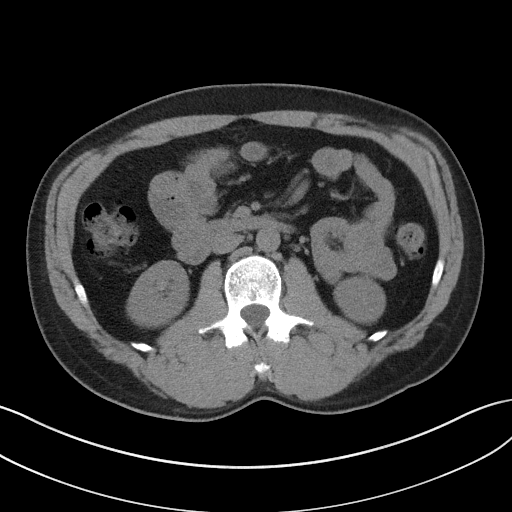
[im 65/102  bone]
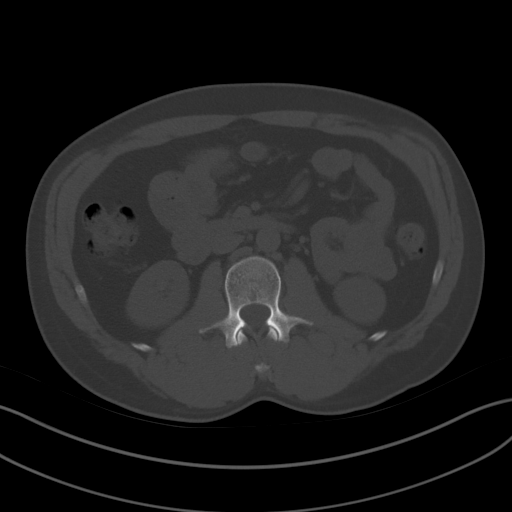
[im 73/102  soft-tissue]
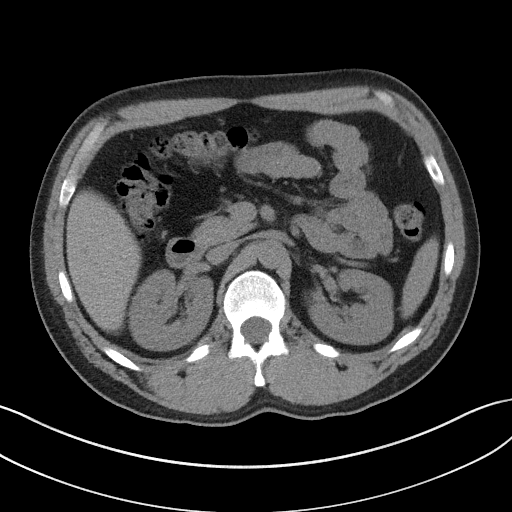
[im 81/102  soft-tissue]
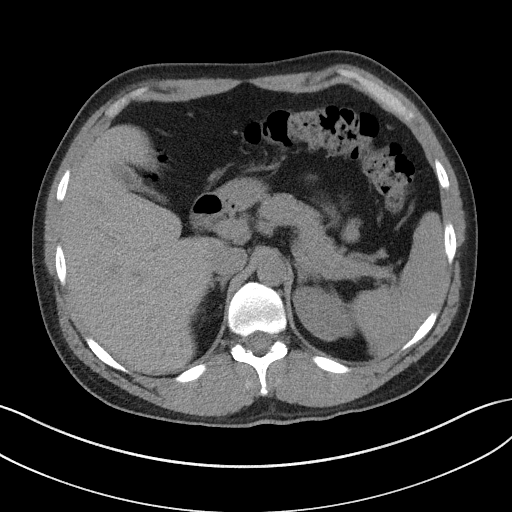
[im 89/102  soft-tissue]
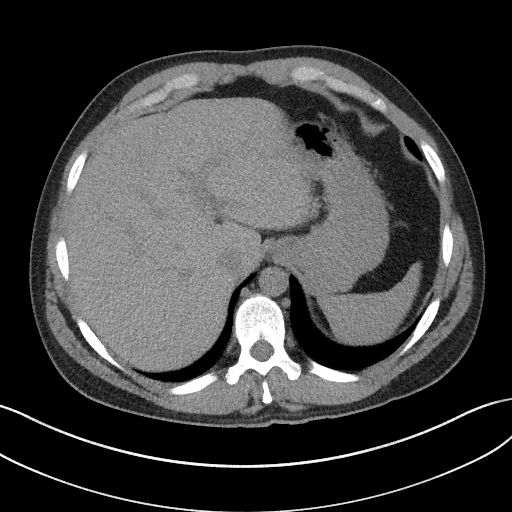
[im 97/102  soft-tissue]
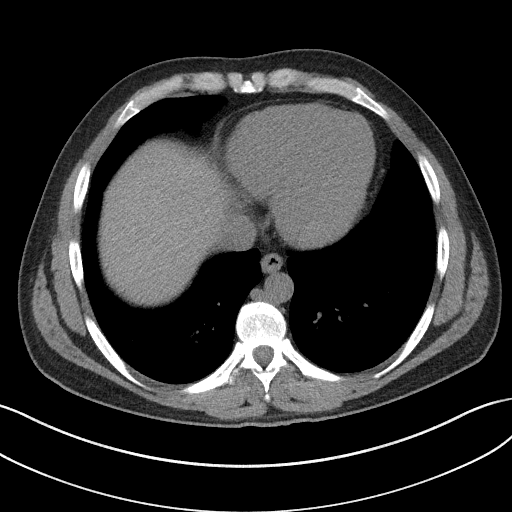

[Series 5: coronal st · coronal · 0.78mm/px · 3 of 97 slices shown]
[im 33/97  soft-tissue]
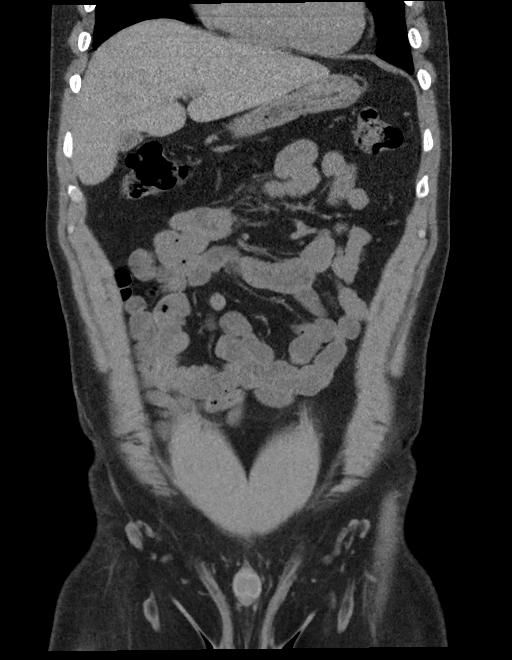
[im 43/97  soft-tissue]
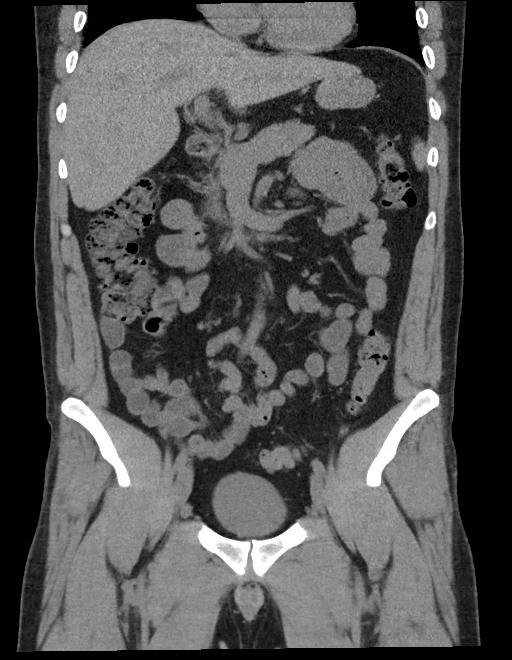
[im 54/97  soft-tissue]
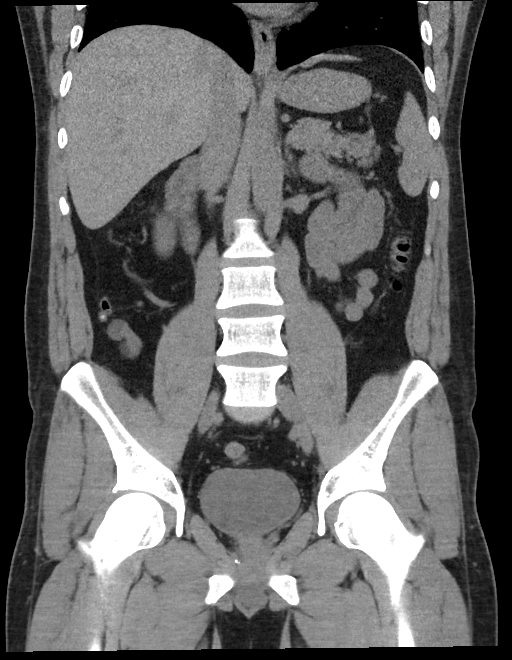

[16 of 46 positions shown; findings below may reference images not displayed]

FINDINGS: LOWER CHEST: Normal.

HEPATOBILIARY: Normal hepatic contours. No intra- or extrahepatic
biliary dilatation. The gallbladder is normal.

PANCREAS: Normal pancreas. No ductal dilatation or peripancreatic
fluid collection.

SPLEEN: Normal.

ADRENALS/URINARY TRACT: The adrenal glands are normal. No
hydronephrosis, nephroureterolithiasis or solid renal mass. The
urinary bladder is normal for degree of distention

STOMACH/BOWEL: There is no hiatal hernia. Normal duodenal course and
caliber. No small bowel dilatation or inflammation. No focal colonic
abnormality. Normal appendix.

VASCULAR/LYMPHATIC: Normal course and caliber of the major abdominal
vessels. No abdominal or pelvic lymphadenopathy.

REPRODUCTIVE: Normal prostate size with symmetric seminal vesicles.

MUSCULOSKELETAL. No bony spinal canal stenosis or focal osseous
abnormality.

OTHER: None.
IMPRESSION: No acute abnormality of the abdomen or pelvis.

## 2023-06-27 ENCOUNTER — Encounter: Payer: Self-pay | Admitting: Emergency Medicine

## 2023-06-27 ENCOUNTER — Emergency Department

## 2023-06-27 ENCOUNTER — Emergency Department
Admission: EM | Admit: 2023-06-27 | Discharge: 2023-06-28 | Disposition: A | Discharge status: Other Acute Inpatient Hospital | Attending: Emergency Medicine | Admitting: Emergency Medicine

## 2023-06-27 ENCOUNTER — Other Ambulatory Visit: Payer: Self-pay

## 2023-06-27 DIAGNOSIS — J45909 Unspecified asthma, uncomplicated: Secondary | ICD-10-CM | POA: Diagnosis not present

## 2023-06-27 DIAGNOSIS — S67192A Crushing injury of right middle finger, initial encounter: Secondary | ICD-10-CM | POA: Diagnosis not present

## 2023-06-27 DIAGNOSIS — S60921A Unspecified superficial injury of right hand, initial encounter: Secondary | ICD-10-CM | POA: Insufficient documentation

## 2023-06-27 DIAGNOSIS — Z23 Encounter for immunization: Secondary | ICD-10-CM | POA: Diagnosis not present

## 2023-06-27 DIAGNOSIS — S63280A Dislocation of proximal interphalangeal joint of right index finger, initial encounter: Secondary | ICD-10-CM | POA: Diagnosis not present

## 2023-06-27 DIAGNOSIS — S67194A Crushing injury of right ring finger, initial encounter: Secondary | ICD-10-CM | POA: Insufficient documentation

## 2023-06-27 DIAGNOSIS — S6991XA Unspecified injury of right wrist, hand and finger(s), initial encounter: Secondary | ICD-10-CM

## 2023-06-27 DIAGNOSIS — W230XXA Caught, crushed, jammed, or pinched between moving objects, initial encounter: Secondary | ICD-10-CM | POA: Diagnosis not present

## 2023-06-27 LAB — CBC WITH DIFFERENTIAL/PLATELET
Abs Immature Granulocytes: 0.03 10*3/uL (ref 0.00–0.07)
Basophils Absolute: 0.1 10*3/uL (ref 0.0–0.1)
Basophils Relative: 0 %
Eosinophils Absolute: 0.1 10*3/uL (ref 0.0–0.5)
Eosinophils Relative: 1 %
HCT: 44.7 % (ref 39.0–52.0)
Hemoglobin: 15.4 g/dL (ref 13.0–17.0)
Immature Granulocytes: 0 %
Lymphocytes Relative: 47 %
Lymphs Abs: 5.8 10*3/uL — ABNORMAL HIGH (ref 0.7–4.0)
MCH: 28.7 pg (ref 26.0–34.0)
MCHC: 34.5 g/dL (ref 30.0–36.0)
MCV: 83.2 fL (ref 80.0–100.0)
Monocytes Absolute: 0.9 10*3/uL (ref 0.1–1.0)
Monocytes Relative: 7 %
Neutro Abs: 5.6 10*3/uL (ref 1.7–7.7)
Neutrophils Relative %: 45 %
Platelets: 275 10*3/uL (ref 150–400)
RBC: 5.37 MIL/uL (ref 4.22–5.81)
RDW: 12.8 % (ref 11.5–15.5)
Smear Review: NORMAL
WBC: 12.5 10*3/uL — ABNORMAL HIGH (ref 4.0–10.5)
nRBC: 0 % (ref 0.0–0.2)

## 2023-06-27 LAB — BASIC METABOLIC PANEL
Anion gap: 12 (ref 5–15)
BUN: 22 mg/dL — ABNORMAL HIGH (ref 6–20)
CO2: 20 mmol/L — ABNORMAL LOW (ref 22–32)
Calcium: 9.2 mg/dL (ref 8.9–10.3)
Chloride: 105 mmol/L (ref 98–111)
Creatinine, Ser: 1.11 mg/dL (ref 0.61–1.24)
GFR, Estimated: >60 mL/min (ref >60)
Glucose, Bld: 114 mg/dL — ABNORMAL HIGH (ref 70–99)
Potassium: 3.3 mmol/L — ABNORMAL LOW (ref 3.5–5.1)
Sodium: 137 mmol/L (ref 135–145)

## 2023-06-27 MED ORDER — ONDANSETRON HCL 4 MG/2ML IJ SOLN
4.0000 mg | Freq: Once | INTRAMUSCULAR | Status: AC
  Administered 2023-06-27: 4 mg via INTRAVENOUS
  Filled 2023-06-27: qty 2

## 2023-06-27 MED ORDER — SODIUM CHLORIDE 0.9 % IV SOLN
2.0000 g | Freq: Three times a day (TID) | INTRAVENOUS | Status: DC
  Administered 2023-06-28: 2 g via INTRAVENOUS
  Filled 2023-06-27: qty 10

## 2023-06-27 MED ORDER — VANCOMYCIN HCL IN DEXTROSE 1-5 GM/200ML-% IV SOLN
1000.0000 mg | Freq: Once | INTRAVENOUS | Status: AC
  Administered 2023-06-28: 1000 mg via INTRAVENOUS
  Filled 2023-06-27: qty 200

## 2023-06-27 MED ORDER — TETANUS-DIPHTH-ACELL PERTUSSIS 5-2.5-18.5 LF-MCG/0.5 IM SUSY
0.5000 mL | PREFILLED_SYRINGE | Freq: Once | INTRAMUSCULAR | Status: AC
  Administered 2023-06-27: 0.5 mL via INTRAMUSCULAR
  Filled 2023-06-27: qty 0.5

## 2023-06-27 MED ORDER — HYDROMORPHONE HCL 1 MG/ML IJ SOLN
1.0000 mg | Freq: Once | INTRAMUSCULAR | Status: AC
  Administered 2023-06-27: 1 mg via INTRAVENOUS
  Filled 2023-06-27: qty 1

## 2023-06-27 MED ORDER — LIDOCAINE HCL (PF) 1 % IJ SOLN: 30.0000 mL | Freq: Once | INTRAMUSCULAR | Status: DC

## 2023-06-27 MED ORDER — SODIUM CHLORIDE 0.9 % IV SOLN
1.0000 g | Freq: Three times a day (TID) | INTRAVENOUS | Status: DC
  Filled 2023-06-27 (×2): qty 5

## 2023-06-27 MED ORDER — LIDOCAINE HCL (PF) 1 % IJ SOLN
5.0000 mL | Freq: Once | INTRAMUSCULAR | Status: AC
  Administered 2023-06-27: 5 mL
  Filled 2023-06-27: qty 5

## 2023-06-27 MED ORDER — MORPHINE SULFATE (PF) 4 MG/ML IV SOLN
4.0000 mg | Freq: Once | INTRAVENOUS | Status: AC
  Administered 2023-06-27: 4 mg via INTRAVENOUS
  Filled 2023-06-27: qty 1
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
## 2023-06-27 NOTE — ED Notes (Signed)
 Lidocaine without epinephrine provided to Dr Larinda Buttery suture cart with dressing and cleansing supplies @ bs pt has family x2 @ bs pt continues to speak in full clear sentences rt hand is on chux absorbant pad with second pad to cover injury

## 2023-06-28 ENCOUNTER — Encounter (HOSPITAL_COMMUNITY): Payer: Self-pay

## 2023-06-28 DIAGNOSIS — J45909 Unspecified asthma, uncomplicated: Secondary | ICD-10-CM | POA: Diagnosis not present

## 2023-06-28 DIAGNOSIS — S67194A Crushing injury of right ring finger, initial encounter: Secondary | ICD-10-CM | POA: Diagnosis not present

## 2023-06-28 DIAGNOSIS — Z23 Encounter for immunization: Secondary | ICD-10-CM | POA: Diagnosis not present

## 2023-06-28 DIAGNOSIS — S63280A Dislocation of proximal interphalangeal joint of right index finger, initial encounter: Secondary | ICD-10-CM | POA: Diagnosis not present

## 2023-06-28 DIAGNOSIS — W230XXA Caught, crushed, jammed, or pinched between moving objects, initial encounter: Secondary | ICD-10-CM | POA: Diagnosis not present

## 2023-06-28 DIAGNOSIS — S67192A Crushing injury of right middle finger, initial encounter: Secondary | ICD-10-CM | POA: Diagnosis not present

## 2023-06-28 DIAGNOSIS — S60921A Unspecified superficial injury of right hand, initial encounter: Secondary | ICD-10-CM | POA: Diagnosis present

## 2023-06-28 MED ORDER — HYDROMORPHONE HCL 1 MG/ML IJ SOLN
1.0000 mg | Freq: Once | INTRAMUSCULAR | Status: AC
  Administered 2023-06-28: 1 mg via INTRAVENOUS
  Filled 2023-06-28: qty 1

## 2023-06-28 MED ORDER — HYDROMORPHONE HCL 1 MG/ML IJ SOLN
0.5000 mg | Freq: Once | INTRAMUSCULAR | Status: AC
  Administered 2023-06-28: 0.5 mg via INTRAVENOUS
  Filled 2023-06-28: qty 0.5

## 2023-06-28 MED ORDER — NICOTINE 21 MG/24HR TD PT24
21.0000 mg | MEDICATED_PATCH | Freq: Once | TRANSDERMAL | Status: DC
  Administered 2023-06-28: 21 mg via TRANSDERMAL
  Filled 2023-06-28: qty 1

## 2023-06-28 NOTE — ED Notes (Signed)
 Pt has a bed assignment @ [facility] /ED to ED/Accepting:Dr Selek/ Report # [phone] Karla/[facility] for transport.

## 2023-06-28 NOTE — ED Notes (Signed)
 ..  EMTALA: REQUIRED DOCUMENTATION COMPLETED AND REVIEWED BY WRITER PRIOR TO PT TRANSFER MD REASSESSMENT EMTALA RN SECTION TRANSFER E-SIGN VS WITHIN REQUIRED TIME

## 2023-06-28 NOTE — ED Notes (Signed)
 Report to Mateo Flow RN

## 2023-06-28 NOTE — ED Notes (Signed)
 Radial pulse to rt hand remains intact, pt denies loss of sensation to rt hand fx site remains open with saline dressing applied

## 2023-06-28 NOTE — ED Notes (Signed)
 Pt updated on POC transfer to [facility] ED pt awaiting transport from [facility]

## 2023-06-28 NOTE — ED Notes (Signed)
 Called to [facility] per MD Paduchowski @1240  am/Consult to Transfer/Images Power shared & Face sheet Faxed/Rep Victorino Dike.
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
## 2023-06-28 NOTE — ED Notes (Signed)
 Called to [facility]) per MD Paduchowski @1220am Norlene Campbell to [employer].

## 2023-07-05 MED FILL — Fentanyl Citrate Preservative Free (PF) Inj 100 MCG/2ML: INTRAMUSCULAR | Qty: 2 | Status: AC
